# Patient Record
Sex: Female | Born: 1950 | Race: White | Hispanic: No | Marital: Married
Health system: Midwestern US, Academic
[De-identification: ages and names within clinical notes are randomized; demographics above are authoritative.]

---

## 2017-03-29 MED ORDER — REGADENOSON 0.4 MG/5 ML IV SYRG
.4 mg | Freq: Once | INTRAVENOUS | 0 refills | Status: CP
Start: 2017-03-29 — End: ?

## 2017-03-29 MED ORDER — NITROGLYCERIN 0.4 MG SL SUBL
.4 mg | SUBLINGUAL | 0 refills | Status: AC | PRN
Start: 2017-03-29 — End: ?

## 2017-03-29 MED ORDER — AMINOPHYLLINE 500 MG/20 ML IV SOLN
50 mg | INTRAVENOUS | 0 refills | Status: AC | PRN
Start: 2017-03-29 — End: ?

## 2017-03-29 MED ORDER — ALBUTEROL SULFATE 90 MCG/ACTUATION IN HFAA
2 | RESPIRATORY_TRACT | 0 refills | Status: DC | PRN
Start: 2017-03-29 — End: 2017-04-03

## 2017-03-29 MED ORDER — SODIUM CHLORIDE 0.9 % IV SOLP
250 mL | INTRAVENOUS | 0 refills | Status: AC | PRN
Start: 2017-03-29 — End: ?

## 2017-04-03 MED ORDER — EZETIMIBE 10 MG PO TAB
10 mg | ORAL_TABLET | Freq: Every day | ORAL | 3 refills | Status: DC
Start: 2017-04-03 — End: 2017-06-07

## 2017-04-10 MED ORDER — ASPIRIN 325 MG PO TAB
325 mg | Freq: Once | ORAL | 0 refills | Status: CP
Start: 2017-04-10 — End: ?

## 2017-04-10 MED ORDER — ALUMINUM-MAGNESIUM HYDROXIDE 200-200 MG/5 ML PO SUSP
30 mL | ORAL | 0 refills | Status: DC | PRN
Start: 2017-04-10 — End: 2017-04-10

## 2017-04-10 MED ORDER — DIPHENHYDRAMINE HCL 50 MG/ML IJ SOLN
25 mg | INTRAVENOUS | 0 refills | Status: DC | PRN
Start: 2017-04-10 — End: 2017-04-10

## 2017-04-10 MED ORDER — NITROGLYCERIN 0.4 MG SL SUBL
.4 mg | SUBLINGUAL | 0 refills | Status: DC | PRN
Start: 2017-04-10 — End: 2017-04-10

## 2017-04-10 MED ORDER — TEMAZEPAM 15 MG PO CAP
15 mg | Freq: Every evening | ORAL | 0 refills | Status: DC | PRN
Start: 2017-04-10 — End: 2017-04-10

## 2017-04-10 MED ORDER — MAGNESIUM HYDROXIDE 2,400 MG/10 ML PO SUSP
10 mL | ORAL | 0 refills | Status: DC | PRN
Start: 2017-04-10 — End: 2017-04-10

## 2017-04-10 MED ORDER — SODIUM CHLORIDE 0.9 % IV SOLP
INTRAVENOUS | 0 refills | Status: DC
Start: 2017-04-10 — End: 2017-04-10

## 2017-04-10 MED ORDER — ONDANSETRON HCL (PF) 4 MG/2 ML IJ SOLN
4 mg | INTRAVENOUS | 0 refills | Status: DC | PRN
Start: 2017-04-10 — End: 2017-04-10

## 2017-04-10 MED ORDER — ISOSORBIDE MONONITRATE 30 MG PO TB24
30 mg | ORAL_TABLET | Freq: Every morning | ORAL | 3 refills | 30.00000 days | Status: DC
Start: 2017-04-10 — End: 2017-04-12

## 2017-04-10 MED ORDER — DIPHENHYDRAMINE HCL 25 MG PO CAP
25 mg | ORAL | 0 refills | Status: DC | PRN
Start: 2017-04-10 — End: 2017-04-10

## 2017-04-10 MED ORDER — LIDOCAINE (PF) 10 MG/ML (1 %) IJ SOLN
.1-2 mL | INTRAMUSCULAR | 0 refills | Status: DC | PRN
Start: 2017-04-10 — End: 2017-04-10

## 2017-04-12 MED ORDER — RANOLAZINE 500 MG PO TB12
500 mg | ORAL_TABLET | Freq: Two times a day (BID) | ORAL | 5 refills | Status: DC
Start: 2017-04-12 — End: 2017-06-07

## 2017-04-22 MED ORDER — NITROGLYCERIN 0.4 MG SL SUBL
ORAL_TABLET | SUBLINGUAL | 3 refills | 9.00000 days | Status: DC
Start: 2017-04-22 — End: 2018-12-03

## 2017-05-01 MED ORDER — ONDANSETRON HCL (PF) 4 MG/2 ML IJ SOLN
4 mg | INTRAVENOUS | 0 refills | Status: DC | PRN
Start: 2017-05-01 — End: 2017-05-02

## 2017-05-01 MED ORDER — FENTANYL CITRATE (PF) 50 MCG/ML IJ SOLN
12.5 ug | Freq: Once | INTRAVENOUS | 0 refills | Status: CP
Start: 2017-05-01 — End: ?

## 2017-05-01 MED ORDER — PRAMIPEXOLE 4.5 MG PO TB24
4.5 mg | Freq: Every evening | ORAL | 0 refills | Status: DC
Start: 2017-05-01 — End: 2017-05-02

## 2017-05-01 MED ORDER — NITROGLYCERIN IN 5 % DEXTROSE 50 MG/250 ML (200 MCG/ML) IV SOLN
.1-1.5 ug/kg/min | INTRAVENOUS | 0 refills | Status: DC
Start: 2017-05-01 — End: 2017-05-02

## 2017-05-01 MED ORDER — GLIMEPIRIDE 4 MG PO TAB
8 mg | Freq: Every day | ORAL | 0 refills | Status: DC
Start: 2017-05-01 — End: 2017-05-02

## 2017-05-01 MED ORDER — RANOLAZINE 500 MG PO TB12
500 mg | Freq: Two times a day (BID) | ORAL | 0 refills | Status: DC
Start: 2017-05-01 — End: 2017-05-02

## 2017-05-01 MED ORDER — ALUMINUM-MAGNESIUM HYDROXIDE 200-200 MG/5 ML PO SUSP
30 mL | ORAL | 0 refills | Status: DC | PRN
Start: 2017-05-01 — End: 2017-05-02

## 2017-05-01 MED ORDER — GABAPENTIN 300 MG PO CAP
300 mg | Freq: Every evening | ORAL | 0 refills | Status: DC
Start: 2017-05-01 — End: 2017-05-02

## 2017-05-01 MED ORDER — TICAGRELOR 90 MG PO TAB
90 mg | Freq: Two times a day (BID) | ORAL | 0 refills | Status: DC
Start: 2017-05-01 — End: 2017-05-02

## 2017-05-01 MED ORDER — DIPHENHYDRAMINE HCL 50 MG/ML IJ SOLN
25 mg | INTRAVENOUS | 0 refills | Status: DC | PRN
Start: 2017-05-01 — End: 2017-05-02

## 2017-05-01 MED ORDER — DIPHENHYDRAMINE HCL 25 MG PO CAP
25 mg | ORAL | 0 refills | Status: DC | PRN
Start: 2017-05-01 — End: 2017-05-02

## 2017-05-01 MED ORDER — MAGNESIUM OXIDE 400 MG PO TAB
400 mg | Freq: Every day | ORAL | 0 refills | Status: DC
Start: 2017-05-01 — End: 2017-05-02

## 2017-05-01 MED ORDER — SODIUM CHLORIDE 0.9 % IV SOLP
INTRAVENOUS | 0 refills | Status: DC
Start: 2017-05-01 — End: 2017-05-02

## 2017-05-01 MED ORDER — ACETAMINOPHEN/LIDOCAINE/ANTACID DS(#) 1:1:3  PO SUSP
30 mL | Freq: Once | ORAL | 0 refills | Status: CP
Start: 2017-05-01 — End: ?

## 2017-05-01 MED ORDER — EZETIMIBE 10 MG PO TAB
10 mg | Freq: Every day | ORAL | 0 refills | Status: DC
Start: 2017-05-01 — End: 2017-05-02

## 2017-05-01 MED ORDER — NITROGLYCERIN 0.4 MG SL SUBL
.4 mg | SUBLINGUAL | 0 refills | Status: DC | PRN
Start: 2017-05-01 — End: 2017-05-02

## 2017-05-01 MED ORDER — DOCUSATE SODIUM 100 MG PO CAP
100 mg | Freq: Every day | ORAL | 0 refills | Status: DC | PRN
Start: 2017-05-01 — End: 2017-05-02

## 2017-05-01 MED ORDER — LIDOCAINE (PF) 10 MG/ML (1 %) IJ SOLN
.1-2 mL | INTRAMUSCULAR | 0 refills | Status: DC | PRN
Start: 2017-05-01 — End: 2017-05-02

## 2017-05-01 MED ORDER — PIOGLITAZONE 15 MG PO TAB
15 mg | Freq: Every day | ORAL | 0 refills | Status: DC
Start: 2017-05-01 — End: 2017-05-02

## 2017-05-01 MED ORDER — HELP MEDICATION
Freq: Every evening | ORAL | 0 refills | Status: DC
Start: 2017-05-01 — End: 2017-05-02

## 2017-05-01 MED ORDER — LEVOTHYROXINE 75 MCG PO TAB
75 ug | Freq: Every day | ORAL | 0 refills | Status: DC
Start: 2017-05-01 — End: 2017-05-02

## 2017-05-01 MED ORDER — FENTANYL CITRATE (PF) 50 MCG/ML IJ SOLN
25 ug | Freq: Once | INTRAVENOUS | 0 refills | Status: DC
Start: 2017-05-01 — End: 2017-05-02

## 2017-05-01 MED ORDER — TEMAZEPAM 15 MG PO CAP
15 mg | Freq: Every evening | ORAL | 0 refills | Status: DC | PRN
Start: 2017-05-01 — End: 2017-05-02

## 2017-05-01 MED ORDER — PANTOPRAZOLE 40 MG PO TBEC
40 mg | Freq: Two times a day (BID) | ORAL | 0 refills | Status: DC
Start: 2017-05-01 — End: 2017-05-02

## 2017-05-01 MED ORDER — ASPIRIN 325 MG PO TAB
325 mg | Freq: Once | ORAL | 0 refills | Status: CP
Start: 2017-05-01 — End: ?

## 2017-05-02 MED ORDER — ASPIRIN 81 MG PO TBEC
81 mg | ORAL_TABLET | Freq: Every day | ORAL | 3 refills | Status: DC
Start: 2017-05-02 — End: 2018-11-17

## 2017-05-02 MED ORDER — ASPIRIN 81 MG PO CHEW
81 mg | Freq: Every day | ORAL | 0 refills | Status: DC
Start: 2017-05-02 — End: 2017-05-02

## 2017-05-02 MED ORDER — TICAGRELOR 90 MG PO TAB
90 mg | ORAL_TABLET | Freq: Two times a day (BID) | ORAL | 3 refills | Status: DC
Start: 2017-05-02 — End: 2017-05-22

## 2017-05-03 MED ORDER — EVOLOCUMAB 140 MG/ML SC SYRG
140 mg | SUBCUTANEOUS | 12 refills | 28.00000 days | Status: DC
Start: 2017-05-03 — End: 2017-06-07

## 2017-05-22 MED ORDER — CLOPIDOGREL 75 MG PO TAB
75 mg | ORAL_TABLET | Freq: Every day | ORAL | 3 refills | 90.00000 days | Status: DC
Start: 2017-05-22 — End: 2017-10-15

## 2017-06-03 ENCOUNTER — Encounter: Admit: 2017-06-03 | Discharge: 2017-06-03 | Payer: MEDICARE

## 2017-06-03 NOTE — Progress Notes
The Prior Authorization for Repatha was denied for Deborah Fox.  The [name].    [name]  Pharmacy Patient Advocate    [name]

## 2017-06-07 ENCOUNTER — Ambulatory Visit: Admit: 2017-06-07 | Discharge: 2017-06-08 | Payer: BC Managed Care – PPO

## 2017-06-07 ENCOUNTER — Encounter: Admit: 2017-06-07 | Discharge: 2017-06-07 | Payer: MEDICARE

## 2017-06-07 DIAGNOSIS — I25118 Atherosclerotic heart disease of native coronary artery with other forms of angina pectoris: Principal | ICD-10-CM

## 2017-06-07 DIAGNOSIS — I10 Essential (primary) hypertension: ICD-10-CM

## 2017-06-07 DIAGNOSIS — E1140 Type 2 diabetes mellitus with diabetic neuropathy, unspecified: ICD-10-CM

## 2017-06-07 DIAGNOSIS — Z951 Presence of aortocoronary bypass graft: ICD-10-CM

## 2017-06-07 DIAGNOSIS — E7800 Pure hypercholesterolemia, unspecified: ICD-10-CM

## 2017-06-07 DIAGNOSIS — I2510 Atherosclerotic heart disease of native coronary artery without angina pectoris: Principal | ICD-10-CM

## 2017-06-07 DIAGNOSIS — Z789 Other specified health status: ICD-10-CM

## 2017-06-07 DIAGNOSIS — E119 Type 2 diabetes mellitus without complications: ICD-10-CM

## 2017-06-07 DIAGNOSIS — E785 Hyperlipidemia, unspecified: ICD-10-CM

## 2017-06-07 DIAGNOSIS — R9439 Abnormal result of other cardiovascular function study: ICD-10-CM

## 2017-06-07 DIAGNOSIS — R600 Localized edema: ICD-10-CM

## 2017-06-07 MED ORDER — FUROSEMIDE 20 MG PO TAB
20 mg | ORAL_TABLET | Freq: Every morning | ORAL | 6 refills | Status: SS
Start: 2017-06-07 — End: 2017-07-31

## 2017-06-07 NOTE — Progress Notes
Date of Service: 06/07/2017    Deborah Fox is a 66 y.o. female.       HPI     Patient has returned to the office for a follow-up office visit, she was last seen on Apr 22, 2017.  Patient underwent a left heart catheterization on April 10, 2017 and she was found to have severe disease of the RCA that supplied a large posterolateral ventricular branch but not the PDA.  Based on the results of the previous myocardial perfusion imaging study and her symptoms of chest pain it was decided to proceed with another left heart catheterization.      This procedure was performed on May 01, 2017, it resulted in PCI of the distal to proximal RCA with drug-eluting stents, and orbital atherectomy from the proximal to distal RCA with  good plaque modification.    Since March 29, 2017 patient gained approximately 13 pounds.  She is very frustrated.  She does not think that is due to her diet or physical inactivity.  She also does not report any sodium indulgence.  She is currently-year-old in cardiac rehab.    She also has statin intolerance and had difficulty taking statin therapy as well as Zetia.  The most recent LDL on 05/01/2017 was 162 mg/dL, the total cholesterol was 253 mg/dL, triglycerides 161 mg/dL, and HDL 58 mg/dL,    Due to dyspnea she also had intolerance to ticagrelor and was switched to clopidogrel.         Vitals:    06/07/17 0806 06/07/17 0815   BP: 148/70 138/64   Pulse: 64    Weight: 89.3 kg (196 lb 12.8 oz)    Height: 1.575 m (5' 2)      Body mass index is 36 kg/m???.     Past Medical History  Patient Active Problem List    Diagnosis Date Noted   ??? 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 Abnormal thallium stress test 04/03/2017   ??? Hospital discharge follow-up 04/03/2017   ??? 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 Initial neck injections of Botox caused neck extensor weakness.     ??? Type 2 diabetes mellitus (HCC) 04/06/2015   ??? Precordial pain 04/06/2015 ??? 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 Weight gain, abnormal 04/06/2015   ??? Pre-operative cardiovascular examination 04/08/2014   ??? Abdominal pain 06/24/2013   ??? Claudication (HCC) 01/08/2012   ??? Chest pain 12/28/2010   ??? 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 CAD (coronary artery disease) 10/06/2009     04/10/17 Cardiac Catheterization by Dr. Chales Abrahams:  1. Significant coronary artery disease manifesting with complete occlusion of the proximal circumflex artery and severe disease of the proximal mid and distal right coronary artery and moderate disease of the left anterior descending.  2. Patent saphenous vein graft to diagonal 1, obtuse marginal 1, obtuse marginal 2, and a jump radial graft to the posterior descending artery.  3. Patent left internal mammary artery to left anterior descending.  4. Severe disease of the right coronary artery that appears to supply a large posterolateral ventricular branch but not the posterior descending artery.  5. Normal left ventricular end-diastolic pressure.  ???     ??? Sleep apnea 10/06/2009   ??? RLS (restless legs syndrome) 10/06/2009   ??? Hypothyroidism 10/06/2009   ??? HTN (hypertension) 10/06/2009   ??? Hyperlipidemia 10/06/2009   ??? GERD (gastroesophageal reflux  disease) 10/06/2009   ??? Lichen planus 10/06/2009         Review of Systems   Constitution: Positive for weight gain.   HENT: Negative.    Eyes: Negative.    Cardiovascular: Positive for dyspnea on exertion and leg swelling.   Respiratory: Negative.    Endocrine: Negative.    Hematologic/Lymphatic: Negative.    Skin: Negative.    Musculoskeletal: Negative.    Gastrointestinal: Negative.    Genitourinary: Negative.    Neurological: Negative.    Psychiatric/Behavioral: Negative.    Allergic/Immunologic: Negative.        Physical Exam  General Appearance: normal in appearance  Skin: warm, moist, no ulcers or xanthomas  Eyes: conjunctivae and lids normal, pupils are equal and round Lips & Oral Mucosa: no pallor or cyanosis  Neck Veins: neck veins are flat, neck veins are not distended  Chest Inspection: chest is normal in appearance  Respiratory Effort: breathing comfortably, no respiratory distress  Auscultation/Percussion: lungs clear to auscultation, no rales or rhonchi, no wheezing  Cardiac Rhythm: regular rhythm and normal rate  Cardiac Auscultation: S1, S2 normal, no rub, no gallop  Murmurs: no murmur  Carotid Arteries: normal carotid upstroke bilaterally, no bruit  Lower Extremity Edema: no lower extremity edema  Abdominal Exam: soft, non-tender, no masses, bowel sounds normal  Liver & Spleen: no organomegaly  Language and Memory: patient responsive and seems to comprehend information  Neurologic Exam: neurological assessment grossly intact      Cardiovascular Studies  Twelve-lead EKG demonstrates normal sinus rhythm, PACs are present    Problems Addressed Today  Encounter Diagnoses   Name Primary?   ??? Coronary artery disease of native artery of native heart with stable angina pectoris (HCC)    ??? Essential hypertension    ??? Pure hypercholesterolemia    ??? Type 2 diabetes mellitus with diabetic neuropathy, without long-term current use of insulin (HCC)        Assessment and Plan     Assessment:    1.  Coronary artery disease  ??? Status post PCI to RCA on 05/01/2017, patient has not experienced any symptoms compatible with angina  ??? Status post CABG, patient underwent a left heart catheterization on April 10, 2017, she is known to have significant coronary artery disease that consist of complete occlusion of the proximal left circumflex artery, severe disease of the proximal mid and distal RCA, moderate disease of the LAD, patent SVG to D1, OM1 and OM 2, jump radial graft to the PDA, patent LIMA to LAD, severe disease of the RCA??? the right coronary system was addressed in this last left heart catheterization, please see above details ??? Patient did developed intolerance to ticagrelor and she was switched to clopidogrel    2.  Diabetes mellitus type 2  ??? Patient is on 2 oral antidiabetics    3.  Hyperlipidemia  ??? The total cholesterol and the LDL are poorly controlled  ??? Patient has intolerance to statin therapy as well as Zetia    4.  History of morbid obesity  ??? Patient underwent previous gastric bypass    5.  Weight gain and bilateral lower extremity edema  ??? This could be due to salt indulgence and probably diastolic dysfunction    Plan:    1.  Furosemide 20 mg p.o. daily  2.  I did ask the patient to eat foods that are high in potassium such as bananas and tomatoes  3.  Chem-7 in 1 week  4.  2D echo Doppler study evaluation  5.  Follow-up office visit in 3 months  6.  I did encourage risk factors modification, low-fat, low-cholesterol, low-carb well-hydrated diet, regular physical activity, I also encouraged the patient to complete the entire cardiac rehab program  7.  In the light of statin intolerance as well as intolerance for Zetia and elevated LDL and underlying significant coronary artery disease we will initiate approval for one  of the PCSK9 inhibitors.  8.  Also, in the light of recent revascularization we will asked the patient to discontinue the Ranexa.         Current Medications (including today's revisions)  ??? aspirin EC 81 mg tablet Take 1 tablet by mouth daily. Take with food.   ??? clopiDOGrel (PLAVIX) 75 mg tablet Take 1 tablet by mouth daily.   ??? dexlansoprazole (+) (DEXILANT) 60 mg capsule Take 60 mg by mouth twice daily.   ??? gabapentin (NEURONTIN) 300 mg capsule Take 300 mg by mouth at bedtime daily.   ??? glimepiride (AMARYL) 4 mg tablet Take 8 mg by mouth daily with breakfast.   ??? levothyroxine (SYNTHROID) 75 mcg tablet Take 75 mcg by mouth daily.   ??? magnesium oxide (MAG-OX) 400 mg tablet Take 400 mg by mouth daily.   ??? nitroglycerin (NITROSTAT) 0.4 mg tablet Take one tablet sublingually three times five minutes apart for chest pain.   ??? pioglitazone (ACTOS) 15 mg tablet Take 15 mg by mouth daily.   ??? pramipexole ER(+) (MIRAPEX ER) 4.5 mg tablet Take 1 tablet by mouth twice daily.   ??? ranolazine ER (RANEXA) 500 mg tablet Take 500 mg by mouth twice daily.

## 2017-06-10 ENCOUNTER — Encounter: Admit: 2017-06-10 | Discharge: 2017-06-10 | Payer: MEDICARE

## 2017-06-10 MED ORDER — EVOLOCUMAB 140 MG/ML SC PNIJ
140 mg | SUBCUTANEOUS | 11 refills | 28.00000 days | Status: AC
Start: 2017-06-10 — End: 2017-06-27

## 2017-06-10 NOTE — Progress Notes
Norina BuzzardBarbara V Montellano's prescription for Repatha is not able to be [facility] due to the patient's prescription insurance.  Pharmacy has contacted Serita GrammesBarbara V Bottomley to notify them that their prescription will be sent to [facility] as required by their prescription insurance.    Approval Period: 06/05/17-06/05/18.    Juliane Lackachael Brynleigh Sequeira  Pharmacy Patient Advocate

## 2017-06-17 ENCOUNTER — Encounter: Admit: 2017-06-17 | Discharge: 2017-06-17 | Payer: MEDICARE

## 2017-06-17 DIAGNOSIS — I25118 Atherosclerotic heart disease of native coronary artery with other forms of angina pectoris: Principal | ICD-10-CM

## 2017-06-17 DIAGNOSIS — E7800 Pure hypercholesterolemia, unspecified: ICD-10-CM

## 2017-06-17 DIAGNOSIS — E1140 Type 2 diabetes mellitus with diabetic neuropathy, unspecified: ICD-10-CM

## 2017-06-17 DIAGNOSIS — I10 Essential (primary) hypertension: ICD-10-CM

## 2017-06-17 LAB — BASIC METABOLIC PANEL
Lab: 0.8 % (ref 11–15)
Lab: 104 FL (ref 80–100)
Lab: 13 % (ref 24–44)
Lab: 141 g/dL (ref 12.0–15.0)
Lab: 152 10*3/uL — ABNORMAL HIGH (ref 80–115)
Lab: 21 g/dL — ABNORMAL HIGH (ref 9.8–20.1)
Lab: 28 pg (ref 26–34)
Lab: 4.1 % (ref 36–45)
Lab: 9.6 FL (ref 7–11)

## 2017-06-18 ENCOUNTER — Ambulatory Visit: Admit: 2017-06-18 | Discharge: 2017-06-18 | Payer: BC Managed Care – PPO

## 2017-06-18 DIAGNOSIS — E7800 Pure hypercholesterolemia, unspecified: ICD-10-CM

## 2017-06-18 DIAGNOSIS — I25118 Atherosclerotic heart disease of native coronary artery with other forms of angina pectoris: Principal | ICD-10-CM

## 2017-06-18 DIAGNOSIS — E1140 Type 2 diabetes mellitus with diabetic neuropathy, unspecified: Secondary | ICD-10-CM

## 2017-06-18 DIAGNOSIS — I10 Essential (primary) hypertension: ICD-10-CM

## 2017-06-18 MED ORDER — PERFLUTREN LIPID MICROSPHERES 1.1 MG/ML IV SUSP
1-20 mL | Freq: Once | INTRAVENOUS | 0 refills | Status: CP
Start: 2017-06-18 — End: ?
  Administered 2017-06-18: 22:00:00 1 mL via INTRAVENOUS

## 2017-06-18 NOTE — Progress Notes
Peripheral IV Insertion Note:  Patient Side: right  Line Orientation:Antecubital  IV Catheter Size: 22G  Number of Attempts:1.  IV capped and flushed with Normal Saline.  IV site without redness, swelling, or pain.  New dressing placed.    After procedure IV cannula removed intact and hemostasis achieved.    Procedure explained, questions answered and Definity administered per standard without complications.   Total of __1_ ml of Definity/NS given slow IVP per sonographer direction.    Morene Crockereborah Talonda Artist, RN

## 2017-06-27 ENCOUNTER — Encounter: Admit: 2017-06-27 | Discharge: 2017-06-27 | Payer: MEDICARE

## 2017-06-27 MED ORDER — EVOLOCUMAB 140 MG/ML SC PNIJ
140 mg | SUBCUTANEOUS | 11 refills | 28.00000 days | Status: AC
Start: 2017-06-27 — End: 2018-04-30

## 2017-06-27 NOTE — Telephone Encounter
-----   Message from Nickolas MadridMarina Hannen, MD sent at 06/27/2017 12:56 PM CDT -----  Please let this patient know that her echocardiogram is overall unremarkable, demonstrated normal left ventricular systolic function.    Thank you    ----- Message -----  From: Nickolas MadridHannen, Marina, MD  Sent: 06/21/2017  10:43 AM  To: Nickolas MadridMarina Hannen, MD

## 2017-06-27 NOTE — Telephone Encounter
Results and recommendations called to patient.

## 2017-07-27 ENCOUNTER — Encounter: Admit: 2017-07-27 | Discharge: 2017-07-27 | Payer: MEDICARE

## 2017-07-27 ENCOUNTER — Emergency Department: Admit: 2017-07-27 | Discharge: 2017-07-27 | Payer: MEDICARE

## 2017-07-27 DIAGNOSIS — E785 Hyperlipidemia, unspecified: ICD-10-CM

## 2017-07-27 DIAGNOSIS — R079 Chest pain, unspecified: Secondary | ICD-10-CM

## 2017-07-27 DIAGNOSIS — E119 Type 2 diabetes mellitus without complications: ICD-10-CM

## 2017-07-27 DIAGNOSIS — R072 Precordial pain: ICD-10-CM

## 2017-07-27 DIAGNOSIS — I10 Essential (primary) hypertension: ICD-10-CM

## 2017-07-27 DIAGNOSIS — R9439 Abnormal result of other cardiovascular function study: ICD-10-CM

## 2017-07-27 DIAGNOSIS — I2510 Atherosclerotic heart disease of native coronary artery without angina pectoris: Principal | ICD-10-CM

## 2017-07-28 ENCOUNTER — Encounter: Admit: 2017-07-28 | Discharge: 2017-07-28 | Payer: MEDICARE

## 2017-07-28 ENCOUNTER — Inpatient Hospital Stay
Admit: 2017-07-28 | Discharge: 2017-07-31 | Disposition: A | Discharge status: Home / Self Care | Payer: BC Managed Care – PPO

## 2017-07-28 LAB — POC TROPONIN
Lab: 0 ng/mL (ref 0.00–0.05)
Lab: 0 ng/mL (ref 0.00–0.05)

## 2017-07-28 LAB — MAGNESIUM
Lab: 1.8 mg/dL (ref 1.6–2.6)
Lab: 1.8 mg/dL — ABNORMAL LOW (ref 1.6–2.6)

## 2017-07-28 LAB — PHENCYCLIDINES-URINE RANDOM: Lab: NEGATIVE % (ref 0–2)

## 2017-07-28 LAB — CBC AND DIFF
Lab: 0.1 10*3/uL (ref 0–0.20)
Lab: 0.1 10*3/uL (ref 0–0.45)
Lab: 0.4 10*3/uL (ref 0–0.80)
Lab: 1 10*3/uL (ref 1.0–4.8)
Lab: 4.3 10*3/uL (ref 1.8–7.0)
Lab: 4.3 M/UL (ref 4.0–5.0)
Lab: 5.9 10*3/uL (ref 4.5–11.0)
Lab: 0 % (ref 0–2)
Lab: 0 10*3/uL (ref 0–0.20)
Lab: 0.1 10*3/uL (ref 0–0.45)
Lab: 0.4 10*3/uL (ref 0–0.80)
Lab: 1.2 10*3/uL (ref 1.0–4.8)
Lab: 10 FL (ref 7–11)
Lab: 117 10*3/uL — ABNORMAL LOW (ref 150–400)
Lab: 12 g/dL (ref 12.0–15.0)
Lab: 15 % — ABNORMAL HIGH (ref 11–15)
Lab: 2 % (ref 0–5)
Lab: 24 % (ref 24–44)
Lab: 3.4 10*3/uL (ref 1.8–7.0)
Lab: 32 pg (ref 26–34)
Lab: 33 g/dL (ref 32.0–36.0)
Lab: 37 % (ref 36–45)
Lab: 5.1 10*3/uL (ref 4.5–11.0)
Lab: 67 % (ref 41–77)
Lab: 7 % (ref 4–12)
Lab: 95 FL (ref 80–100)

## 2017-07-28 LAB — POC CREATININE, RAD: Lab: 0.8 mg/dL (ref 0.4–1.00)

## 2017-07-28 LAB — BARBITURATES-URINE RANDOM: Lab: NEGATIVE mg/dL (ref 0.4–1.00)

## 2017-07-28 LAB — TROPONIN-I
Lab: 0 ng/mL (ref 0.0–0.05)
Lab: 0 ng/mL (ref 0.0–0.05)
Lab: 0 ng/mL (ref 0.0–0.05)

## 2017-07-28 LAB — OPIATES-URINE RANDOM: Lab: NEGATIVE % (ref 0–5)

## 2017-07-28 LAB — BASIC METABOLIC PANEL
Lab: 108 MMOL/L (ref 98–110)
Lab: 143 MMOL/L (ref 137–147)
Lab: 162 mg/dL — ABNORMAL HIGH (ref 70–100)
Lab: 28 MMOL/L (ref 21–30)
Lab: 4.1 MMOL/L (ref 3.5–5.1)
Lab: 7 g/dL (ref 3–12)

## 2017-07-28 LAB — TSH WITH FREE T4 REFLEX
Lab: 1.8 uU/mL (ref 0.35–5.00)
Lab: 1.7 uU/mL (ref 0.35–5.00)

## 2017-07-28 LAB — POC GLUCOSE: Lab: 159 mg/dL — ABNORMAL HIGH (ref 70–100)

## 2017-07-28 LAB — BENZODIAZEPINES-URINE RANDOM: Lab: NEGATIVE mg/dL (ref 8.5–10.6)

## 2017-07-28 LAB — CANNABINOIDS-URINE RANDOM: Lab: NEGATIVE mL/min — ABNORMAL LOW (ref 24–44)

## 2017-07-28 LAB — COCAINE-URINE RANDOM: Lab: NEGATIVE mL/min (ref 4–12)

## 2017-07-28 LAB — BNP POC ER: Lab: 130 pg/mL — ABNORMAL HIGH (ref 0–100)

## 2017-07-28 LAB — POC PT/INR: Lab: 1 (ref 0.8–1.2)

## 2017-07-28 LAB — AMPHETAMINES-URINE RANDOM: Lab: NEGATIVE mg/dL — ABNORMAL LOW (ref 7–25)

## 2017-07-28 MED ORDER — ASPIRIN 81 MG PO TBEC
81 mg | Freq: Every day | ORAL | 0 refills | Status: DC
Start: 2017-07-28 — End: 2017-08-01
  Administered 2017-07-28 – 2017-07-31 (×3): 81 mg via ORAL

## 2017-07-28 MED ORDER — ONDANSETRON HCL (PF) 4 MG/2 ML IJ SOLN
4 mg | INTRAVENOUS | 0 refills | Status: DC | PRN
Start: 2017-07-28 — End: 2017-08-01

## 2017-07-28 MED ORDER — HEPARIN, PORCINE (PF) 5,000 UNIT/0.5 ML IJ SYRG
5000 [IU] | SUBCUTANEOUS | 0 refills | Status: DC
Start: 2017-07-28 — End: 2017-08-01
  Administered 2017-07-28 – 2017-07-31 (×9): 5000 [IU] via SUBCUTANEOUS

## 2017-07-28 MED ORDER — FUROSEMIDE 10 MG/ML IJ SOLN
40 mg | Freq: Once | INTRAVENOUS | 0 refills | Status: CP
Start: 2017-07-28 — End: ?
  Administered 2017-07-28: 08:00:00 40 mg via INTRAVENOUS

## 2017-07-28 MED ORDER — LEVOTHYROXINE 75 MCG PO TAB
75 ug | Freq: Every day | ORAL | 0 refills | Status: DC
Start: 2017-07-28 — End: 2017-08-01
  Administered 2017-07-28 – 2017-07-31 (×4): 75 ug via ORAL

## 2017-07-28 MED ORDER — PATIENTS OWN MEDICATION
1 | Freq: Two times a day (BID) | ORAL | 0 refills | Status: DC
Start: 2017-07-28 — End: 2017-07-28

## 2017-07-28 MED ORDER — PRAMIPEXOLE 4.5 MG PO TB24
4.5 mg | Freq: Two times a day (BID) | ORAL | 0 refills | Status: DC
Start: 2017-07-28 — End: 2017-08-01

## 2017-07-28 MED ORDER — PANTOPRAZOLE 40 MG PO TBEC
40 mg | Freq: Every day | ORAL | 0 refills | Status: DC
Start: 2017-07-28 — End: 2017-08-01
  Administered 2017-07-28 – 2017-07-31 (×4): 40 mg via ORAL

## 2017-07-28 MED ORDER — CAPTOPRIL 12.5 MG PO TAB
6.25 mg | Freq: Three times a day (TID) | ORAL | 0 refills | Status: CN
Start: 2017-07-28 — End: ?

## 2017-07-28 MED ORDER — MAGNESIUM SULFATE IN D5W 1 GRAM/100 ML IV PGBK
1 g | Freq: Once | INTRAVENOUS | 0 refills | Status: CP
Start: 2017-07-28 — End: ?
  Administered 2017-07-28: 20:00:00 1 g via INTRAVENOUS

## 2017-07-28 MED ORDER — CLOPIDOGREL 75 MG PO TAB
75 mg | Freq: Every day | ORAL | 0 refills | Status: DC
Start: 2017-07-28 — End: 2017-08-01
  Administered 2017-07-28 – 2017-07-31 (×4): 75 mg via ORAL

## 2017-07-28 MED ORDER — NITROGLYCERIN 0.4 MG SL SUBL
.4 mg | SUBLINGUAL | 0 refills | Status: DC | PRN
Start: 2017-07-28 — End: 2017-08-01

## 2017-07-28 MED ORDER — MELATONIN 3 MG PO TAB
3 mg | Freq: Every evening | ORAL | 0 refills | Status: DC | PRN
Start: 2017-07-28 — End: 2017-08-01

## 2017-07-28 MED ORDER — GABAPENTIN 300 MG PO CAP
300 mg | Freq: Every evening | ORAL | 0 refills | Status: DC
Start: 2017-07-28 — End: 2017-08-01
  Administered 2017-07-28 – 2017-07-31 (×4): 300 mg via ORAL

## 2017-07-28 NOTE — Acute Stroke Response
RN Stroke Activation Summary       Date of Service:  07/28/2017  Deborah Fox is a 66 y.o.  female.     DOB: [birth year]                  MRN#:  [id number]  Allergies:  Cephalexin; Hydrocodone-acetaminophen; Oxycodone-acetaminophen; Statins-hmg-coa reductase inhibitors; Doxycycline; Erythromycin; Flagyl [metronidazole]; Penicillins; and Sulfa (sulfonamide antibiotics)    Patient Arrival: 2103  ASRT Arrival: 2122  Location of Response : ED [place]    Page Received: 2118          Clinical Presentation: Right facial droop, Sensory changes    Total Stroke Scale Score: 1     Signs & Symptoms: Onset of Symptoms  Onset of Symptoms - Date: 07/27/17  Onset of Symptoms - Time: 1900     Dysphagia screen:  Did Patient Pass The Swallow Screen Part I?: Yes     Did Patient Pass The Swallow Screen Part II: Yes     Did Patient Pass The Swallow Screen: Yes         Assessment & Plan     Summary:  Pt with PMH of CAD with CABG in 2012, DM, HTN, HLD, PCA of RCA, GERD, OSA, and skin CA presented to ED c/o chest pain, while transporting into ED room reported facial numbness, light headedness, dizziness and right sided weakness. Symptom onset was approximately 1900. CT obtained, low suspicion for stroke.    CT/CTP/CTA/IR:   CT time : 2130  CT results communicated to stroke team: 2140  Lab results available to stroke team: 2143         N/A      Plan: Defer to ED physicians for cardiac work up    Call Completion: 2143    RN handoff: Karren Burly, RN     History of Present Illness     Past Medical History:   Diagnosis Date   ??? CAD (coronary artery disease)    ??? Diabetes (HCC)    ??? HTN (hypertension)    ??? Hyperlipemia      Past Surgical History:   Procedure Laterality Date   ??? CORONARY ARTERY BYPASS GRAFT  2007   ??? HX GASTRIC BYPASS  05/24/2009   ??? HERNIA REPAIR  03-15-15   ??? HX CHOLECYSTECTOMY     ??? HX CORONARY ARTERY BYPASS GRAFT       Social History     Social History   ??? Marital status: Married     Spouse name: N/A ??? Number of children: N/A   ??? Years of education: N/A     Social History Main Topics   ??? Smoking status: Never Smoker   ??? Smokeless tobacco: Never Used   ??? Alcohol use No   ??? Drug use: No   ??? Sexual activity: Not on file     Other Topics Concern   ??? Not on file     Social History Narrative   ??? No narrative on file         Bonnita Nasuti, RN

## 2017-07-28 NOTE — ED Notes
CTP HC 411 (ready) please call Sandy at [phone]

## 2017-07-28 NOTE — Acute Stroke Response
NAME:Deborah Fox             MRN: [id number]             DOB:Nov 28, 1951          AGE: 66 y.o.  ADMISSION DATE: 07/27/2017             DAYS ADMITTED: LOS: 0 days    Date of Service:  07/27/2017    Allergies:  Cephalexin; Hydrocodone-acetaminophen; Oxycodone-acetaminophen; Statins-hmg-coa reductase inhibitors; Doxycycline; Erythromycin; Flagyl [metronidazole]; Penicillins; and Sulfa (sulfonamide antibiotics)    Type of ASRT note: Consult       Assessment & Plan     Chief Complaint: chest pain, sob, transient right facial sensory deficit  Assessment: Deborah Fox is a 66 y.o. female with CAD s/p stent 04/2017, CABG, HTN,  HLD, GERD, RLS, OSA, hypothyroidism, who presents to ED with chest pain, sob. Complained of right facial sensory changes on arrival to ED, but they had resolved by stroke team arrival. Has a chronic right facial asymmetry due to excision of skin cancer over right nasolabial fold.     NIHSS 1- right facial nasolabial flattening   CT head- no acute achanges. No ICH , mass effect or loss of gray-white interfac    Impression:  Low suspicion of acute stroke. Patient does not have any neurological deficits on exam (facial asymmetry is chronic). Her complaint today is chest pain + SOB.     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 Patient stroke activated on arrival due to reporting an odd sensation over the right face. About 3 hours prior to arrival, patient and her husband were at a party. She suddenly began feeling mid sternal chest pain, with accompanying sob, dizziness and lightheadedness. On arrival to ED, she felt an odd sensation over the right cheek, but cannot describe it. The sensation resolve dby time of stroke team arrival. On exam, she is noted to have right nasolabial flattening, but this appears to be chronic. (had skin cancer resection over right nasolabial fold. Flattening is seen in all of her ID photos).     She denies any focal weakness, numbness, slurred speech, visual changes.       Review of Systems     All other systems reviewed and are negative.    Constitutional: negative   Eyes: negative   Ears, nose, mouth, throat, and face: negative   Respiratory: negative   Cardiovascular: negative   Gastrointestinal: negative   Genitourinary: negative   Integument/breast: negative   Hematologic/lymphatic: negative   Musculoskeletal: negative   Neurological: negative   Behavioral/Psych: negative   Endocrine: negative   Allergic/Immunologic: negative    Stroke Activation Summary                         Signs & Symptoms: Onset of Symptoms  Onset of Symptoms - Time:  1900     CT:   1. ???No acute intracranial hemorrhage or mass effect.  2. ???Mild nonspecific white matter disease, likely due to chronic   microvascular ischemic changes.       BP: 174/78 (08/11 2010)  Temp: 37 ???C (98.6 ???F) (08/11 2010)  Pulse: 84 (08/11 2010)  Respirations: 20 PER MINUTE (08/11 2010)  SpO2: 98 % (08/11 2010)  O2 Delivery: None (Room Air) (08/11 2010)    NIHSS Completed at: 9:20pm      NIH Stroke Scale Item  Scoring Definition  Score    1a. LOC  0=alert and responsive   1=arousable to minor stimulation   2=arousable only to painful stimulation   3=reflex responses or unrousable  0    1b. LOC questions-as patient???s age and month. Must be exact.  0=both correct   1=one correct (or dysarthria, intubated, foreign language)   2=neither correct  0    1c. Commands-open/close eyes, grip and release non-paretic hand (other 1 step commands or mimic OK)  0=both correct (ok if impaired by weakness)   1=one correct   2=neither correct  0    2. Best Gaze-horizontal EOM by voluntary or Leslie Dales???s  0=normal   1=partial gaze palsy (abnormal gaze in one or both eyes)   2=forced eye deviation or total paresis which cannot be overcome by Leslie Dales???s  0    3. Visual Field-use visual threat if necessary. If monocular, score field of good eye  0=no visual loss   1=partial hemianopia, quadrantanopia, extinction   2=complete hemianopia   3=bilateral hemianopia or blindness  0    4. Facial Palsy-if stuporous, check symmetry of grimace to pain  0=normal   1=minor paralysis, flat NLF, asymm smile   2=partial paralysis (lower face=UMN)   3=complete paralysis (upper and lower face)  1   5. Motor Arm-arms outstretched 90 deg (sitting) or 45 deg (supine) for 10 seconds. Encourage best effort.  0=no drift x 10 seconds   1=drift but doesn???t hit bed   2=some antigravity effort, but can???t sustain   3=no antigravity effort, but even minimal mvt counts   4=no movement at all   X=unable to assess due to amputation, fusion, etc  L/R   0/0    6. Motor Leg-raise leg to 30 degrees supine x 5 seconds  0=no drift x 5 seconds   1=drift but doesn???t hit bed   2=some antigravity effort, but can???t sustain   3=no antigravity effort, but even minimal mvt counts   4=no movement at all   X=unable to assess due to amputation, fusion, etc  L/R   0/0    7. Limb Ataxia-check finger-nose- finger; heel-shin; and score only if out of proportion to paralysis  0=no ataxia (or aphasic, hemiplegic)   1=ataxia in upper or lower extremity   2=ataxia in upper AND lower extremity   X=unable to assess due to amputation, fusion, etc  0   8. Sensory-use safety pin. Check grimace or withdrawal if stuporous. Score only stroke- related losses  0=normal   1=mild-mod unilateral loss but patient aware of touch 9or aphasic, confused)   2=total loss, pt unaware of touch. Coma, bilateral loss  0 9. Best Language-describe cookie jar picture, name objects, read sentences. May use repeating, writing, stereognosis  0=normal   1=mild-mod aphasia (diff but partly comprehensible)   2=severe aphasia (almost no info exchanged)   3=mute, global aphasia, coma. No 1 step commands  0    10. Dysarthria-read list of words  0=normal   1=mild-mod; slurred but intelligible   2=severe; unintelligible or mute  0    11. Extinction/Neglect- simultaneously touch patient on both hands, show fingers in both visual fields, ask about deficit, left hand  0=normal, none detected. (visual loss alone)   1=neglects or extinguishes to double simult stimulation in any modality   2=profound neglect in more than one modality  0    Score    1       Was IV tPA given? No    The patient was not a tPA candidate due to no new neurological deficits     Advanced imaging was interpreted at 9:40pm  The patient was not a thrombectomy candidate due to low suspicion of stroke     Dysphagia screen:                      Performed by nursing staff and passed   Cardiac rhythm on presentation: nsr         Health History     Past Medical History:   Diagnosis Date   ??? CAD (coronary artery disease)    ??? Diabetes (HCC)    ??? HTN (hypertension)    ??? Hyperlipemia      Past Surgical History:   Procedure Laterality Date   ??? CORONARY ARTERY BYPASS GRAFT  2007   ??? HX GASTRIC BYPASS  05/24/2009   ??? HERNIA REPAIR  03-15-15   ??? HX CHOLECYSTECTOMY     ??? HX CORONARY ARTERY BYPASS GRAFT       Family History   Problem Relation Age of Onset   ??? Hypertension Mother    ??? Diabetes Mother    ??? Cancer Father      Social History     Social History   ??? Marital status: Married     Spouse name: N/A   ??? Number of children: N/A   ??? Years of education: N/A     Social History Main Topics   ??? Smoking status: Never Smoker   ??? Smokeless tobacco: Never Used   ??? Alcohol use No   ??? Drug use: No   ??? Sexual activity: Not on file     Other Topics Concern   ??? Not on file     Social History Narrative ??? No narrative on file        Medications:      PRN Medications:         Physical Exam     HEENT: normocephalic, eyes open with no discharge, nares patent, oropharynx is clear with no lesions, palate intact  CV: regular rate, distal pulses palpable  Chest: normal configuration, equal chest rise bilaterally  Ab: soft, non-tender, no masses, no organomegaly  Skin: no rashes or lesions    Extended Neuro Exam:    Mental status: alert, oriented to person/place/time  Speech:    Normal Abnormal   Fluency x    Comprehension x    Articulation x    Repetition x    Naming x        Cranial Nerves:    Normal Abnormal   II x    III, IV, VI x    V x    VII  chronic right nasolabial flattening       VIII x    IX, X x    XI x    XII x        Muscle/motor:   Tone: nml  Bulk: nml  Fasciculations: none  Pronator drift: none     NF NE SA EF EE WE WF FF FE FA TA HF   KF KE DF PF   R 5 5 5 5 5 5 5 5 5 5 5 4   5 5 5 5    L   5 5 5 5 5 5 5 5 5 4   5 5 5 5        Sensation:    Normal RUE LUE RLE LLE   Light Touch x           Coordination:    Normal Abnormal Right Abnormal Left   Finger to Nose x           Heel to Shin  -limited by hip/knee pain/weaknes Limited by hip/knee pain/weakness                 Gait and Sation:deffered         Lab/Radiology/Other Diagnostic Tests:  24-hour labs:    Results for orders placed or performed during the hospital encounter of 07/27/17 (from the past 24 hour(s))   POC PT/INR    Collection Time: 07/27/17  9:36 PM   Result Value Ref Range    INR POC 1.0 0.8 - 1.2   POC TROPONIN    Collection Time: 07/27/17  9:39 PM   Result Value Ref Range    Troponin-I-POC 0.00 0.00 - 0.05 NG/ML   POC GLUCOSE    Collection Time: 07/27/17  9:40 PM   Result Value Ref Range    Glucose, POC 159 (H) 70 - 100 MG/DL   POC CREATININE, RAD    Collection Time: 07/27/17  9:41 PM   Result Value Ref Range    Creatinine, POC 0.8 0.4 - 1.00 MG/DL   BNP POC ER    Collection Time: 07/27/17  9:41 PM Result Value Ref Range    BNP POC 130.0 (H) 0 - 100 PG/ML   CBC AND DIFF    Collection Time: 07/27/17  9:43 PM   Result Value Ref Range    White Blood Cells 5.9 4.5 - 11.0 K/UL    RBC 4.36 4.0 - 5.0 M/UL    Hemoglobin 14.3 12.0 - 15.0 GM/DL    Hematocrit 45.4 36 - 45 %    MCV 95.7 80 - 100 FL    MCH 32.9 26 - 34 PG    MCHC 34.4 32.0 - 36.0 G/DL    RDW 09.8 (H) 11 - 15 %    Platelet Count 125 (L) 150 - 400 K/UL    MPV 10.3 7 - 11 FL    Neutrophils 72 41 - 77 %    Lymphocytes 17 (L) 24 - 44 %    Monocytes 7 4 - 12 %    Eosinophils 2 0 - 5 %    Basophils 2 0 - 2 %    Absolute Neutrophil Count 4.30 1.8 - 7.0 K/UL    Absolute Lymph Count 1.00 1.0 - 4.8 K/UL    Absolute Monocyte Count 0.40 0 - 0.80 K/UL    Absolute Eosinophil Count 0.10 0 - 0.45 K/UL    Absolute Basophil Count 0.10 0 - 0.20 K/UL   BASIC METABOLIC PANEL  Collection Time: 07/27/17  9:43 PM   Result Value Ref Range    Sodium 143 137 - 147 MMOL/L    Potassium 4.1 3.5 - 5.1 MMOL/L    Chloride 108 98 - 110 MMOL/L    CO2 28 21 - 30 MMOL/L    Anion Gap 7 3 - 12    Glucose 162 (H) 70 - 100 MG/DL    Blood Urea Nitrogen 19 7 - 25 MG/DL    Creatinine 0.98 0.4 - 1.00 MG/DL    Calcium 9.2 8.5 - 11.9 MG/DL    eGFR Non African American >60 >60 mL/min    eGFR African American >60 >60 mL/min     Glucose: (!) 162 (07/27/17 2143)  POC Glucose (Download): (!) 159 (07/27/17 2140)  Pertinent radiology reviewed.

## 2017-07-28 NOTE — Progress Notes
Pharmacy Note: Patients Own Med    The following medications have been identified by pharmacy and placed in the locked medication box for storage:  Med: Pramipexole dihydrochloride ER tab 4.5mg      Sig: Take 1 tablet twice a day  Mfg: [employer] [email or web address]  Batch: [id number]  Exp: 03/2018  Qty: 4 tabs    The patient may use their own supply of these medications during this admission.  All doses should be administered and documented per hospital policy.

## 2017-07-28 NOTE — Progress Notes
RESPIRATORY THERAPY  ADULT PROTOCOL EVALUATION      RESPIRATORY PROTOCOL PLAN    Medications       Note: If indicated by protocol, medication orders will be placed by therapist.    Procedures  IPPB: Place a nursing order for "IS Q1h While Awake" for any of Lung Expansion indicators  Oxygen/Humidity: O2 to keep SpO2 > 92%  Monitoring: Pulse oximetry BID & PRN       _____________________________________________________________    PATIENT EVALUATION RESULTS    Chart Review  * Pulmonary Hx: No pulmonary diagnosis OR no smoking hx    * Surgical Hx: No surgery OR last surgery > 6 weeks ago OR trach/stoma (BA)    * Chest X-Ray: Clear OR not available    * PFT/Oxygenation: FEV1, PEFR < 70% OR Pa02 < 70 RA OR Sp02 <92% RA OR Fi02 > 0.21 to keep Sp02 > 92% OR < 24 hours post-op (02 & oxim) OR chronic C02 retention (C02)      Patient Assessment  * Respiratory Pattern: Regular pattern and rate OR good chest excursion with deep breathing    * Breath Sounds: Clear apically, but diminished in bases (LE) OR CHF related crackles (02) (oximetry)    * Cough / Sputum: Strong, effective cough OR nonproductive    * Mental Status: Alert, oriented, cooperative    * Activity Level: Ambulatory with assistance      Priority Index  Total Points: 4 Points  * Priority Index: 1    PRIORITY INDEX GUIDELINES*  Priority Points   1 0-9 points   2 9-18 points   3 > 18 points   + Pulm Dx or Home Rx   *Higher points indicate higher acuity.      Therapist: Reggie PileKara Kaesyn Johnston, RT  Date: 07/28/2017      Key  AC = Airway clearance  AM = Aerosolized medication  BA = Bland aerosol  DB&C = Deep breathe & cough  FEV1 = Forced expiratory volume in first second)  IC = Inspiratory capacity  LE = Lung expansion  MDI = Metered dose inhaler  Neb = Nebulizer  O2 = Oxygen  Oxim =Oximetry  PEFR = Peak expiratory flow rate  RRT = Rapid Response Team

## 2017-07-28 NOTE — Progress Notes
Assumed pt care at 0700.  VSS per trend, A&Ox4, SB/SR on tele, tolerating RA.  C/o pain d/t restless leg syndrome, requesting home Mirapex. Pharmacy at bedside to approve POM.   Denies n/v, SOA.  UOP adequate.  Ambulating ad lib. Call light within reach.     1800-No changes or acute events during shift. Pt states leg pain is improved after med administration. Will cont to monitor and handoff to night RN.

## 2017-07-28 NOTE — Progress Notes
Assumed care at approximately 0220  Assessments completed and documented via doc flowhsheets  AOx4, on RA, VSS per trend, SB/SR on tele   Pt. Denies chest pain and SOB  UOP adequate and BM PTA  No further needs at this time  Will continue to monitor

## 2017-07-28 NOTE — Consults
CLINICAL NUTRITION                                                        Clinical Nutrition Education Summary     MARLEA GAMBILL             MRN: [id number]             DOB:[birth year]          AGE: 66 y.o.  ADMISSION DATE: 07/27/2017             DAYS ADMITTED: LOS: 0 days    Heart Failure Nutrition Education    Education on Low Sodium Diet was provided.  Topics included the following:   ? Indications for diet  ? Sodium limit of 2,000 milligrams daily  ? Foods recommended/not recommended  ? Sodium content of foods (examples)  ? Reading food labels  ? Food preparation suggestions  ? Sodium-free seasonings  ? Sample meals and snacks  ? Eating out tips  Written materials were provided.    Phone number was given for any further nutrition-related questions.    Learner(s): Patient  Learner's Response:     ??? Verbalized understanding of key dietary guidelines taught.  ??? Verbalized his/her intention to make needed dietary changes.    Comments:  66 year old Caucasian female with past history of CAD status post CABG, DES placement on 5/18 on aspirin/Plavix, hypertension, T2 DM, hyperlipidemia, GERD and sleep apnea presented to ED with complaints of having chest pain and abnormal sensation over her right side of the face. Consult received HF/ low sodium diet education. Pt reports good appetite. Denies any n/v/c/d. Pt denies any wt. loss but does report wt.gain; pt feels that it is most likely fluid. Pt states that she follows a low sodium diet at home and tries to have a good source of protein with every meal. Pt does admit to adding salt to food occasionally and going out to eat about once a week Pt denies relying on processed canned or frozen foods; cooks most meals at home. Encouraged pt to not use salt shaker and discussed some low sodium or no salt seasoning alternatives to use at home; Mrs., Sharilyn Sites, onion powder and garlic powder. Encouraged pt to choose low sodium options or smaller portions when out to eat. Pt verbalizes understanding. No further questions at this time.Gave handouts and contact information.  Anticipate limited compliance. Appreciate the consult.     Alvin Critchley [facility]  Dietitian Assistant  [phone]

## 2017-07-28 NOTE — Progress Notes
Patient arrived to room # (HC4-11) via wheelchair accompanied by RN. Patient transferred to the bed without assistance. Bedside safety checks completed. Initial patient assessment completed, refer to flowsheet for details. Admission skin assessment completed by: Luster Landsbergenee, RN    Pressure Injury Present on Hospital Admission (within 24 hours): No    1. Occiput: No  2. Ear: No  3. Scapula: No  4. Spinous Process: No  5. Shoulder: No  6. Elbow: No  7. Iliac Crest: No  8. Sacrum/Coccyx: No  9. Ischial Tuberosity: No  10. Trochanter: No  11. Knee: No  12. Malleolus: No  13. Heel: No  14. Toes: No  15. Assessed for device associated injury No  16. Nursing Nutrition Assessment Completed No    See Doc Flowsheet for additional wound details.

## 2017-07-28 NOTE — Consults
CARDIOLOGY CONSULT NOTE                     Reason for Consult:  Chest pain with New oset volume overload. Likely HF    History of Present Illness  Deborah Fox is a 66 y.o. female with PMH sig for coronary artery disease status post prior CABG (LIMA to LAD, saphenous vein graft sequentially to diagonal 1, obtuse marginal 1 and obtuse marginal 2, and a jump radial graft from the saphenous vein graft to posterior descending artery), recent DES to RCA in May 2018, hypertension, diabetes, hyperlipidemia, sleep apnea who was admitted 07/27/2017 for concerns of chest pain.     Patient states that 2 days ago she noticed some chest discomfort when she was in the grocery store.  However that resolved spontaneously.  She then had recurrent chest discomfort which was sharp in quality, substernal and radiating to the back, occurred at rest and resolved spontaneously.  No history of similar episodes in the past.  She denies any associated nausea nausea or shortness of breath.  She did state that she felt numbness of her face during the episode and for the same reason, code stroke was activated on arrival to the ER.  She also reports of weight gain with worsening leg swelling as well as abdominal distention for the past few months.  She states that she has gained approximately 20-25 pounds, her baseline weight is around 184 pounds according to the patient.  Her weight on admission was 206 pounds.    On arrival to the ER, her initial workup was significant for troponin within normal limits ???3, TSH 1.74, BNP 130, UDS was negative.      Most recent 2D ECHO performed 06/18/2017  ??? Technically difficult study; i.v. transpulmonary contrast was used to define the endocardial borders.  ??? Normal left ventricular systolic function, estimated ejection fraction is 65%.  ??? Normal left ventricular diastolic function.  ??? The right ventricle is mildly dilated with preserved systolic function. TAPSE ranged from 1.3 cm to 1.4 cm (reference range 1.5 - 2.0 cm  ??? Mild mitral and tricuspid valve regurgitation.  ??? Estimated Peak Systolic PA Pressure: 24 mmHg    Most recent stress test performed 03/29/2017  This study is abnormal secondary to the small to medium size, mild to moderate intensity, predominantly reversible perfusion normality of the basal to mid inferolateral, apical inferior, and apical lateral walls.  There is corresponding reduced thickening and mild hypokinesis in the corresponding myocardial segments.  The overall ejection fraction is preserved.  The pharmacologic stress ECG is nondiagnostic for ischemia in the setting of baseline nonspecific T-wave abnormalities in multiple leads.  There are technically no high-risk scintigraphic features present.  ???  Comparison is made to a prior study which was also performed on the ADAC camera on Apr 20, 2015.  The calculated ejection fraction was 68% with an end-diastolic volume of 57 mL's.  Qualitatively, there appeared to be a slight reduction in review the uptake in the apical anterior wall, which is not present on the current study.  The perfusion abnormalities described on the current study are new in comparison.    Most recent left heart catheterization performed 05/01/2017  1. Successful orbital atherectomy with a 1.25 mm CSI burr from the proximal-to-distal RCA with excellent plaque modification.  2. Successful PCI of the distal-to-proximal RCA using a 2.5 x 38 mm Synergy overlapped with a 3.0 x 28 mm Synergy, and a 3.0 x  20 mm Synergy, postdilated with a 3.5 NC with 18-20 atmospheres with excellent angiographic results.  3. Temporary transvenous pacemaker was removed.  4. Aspirin and Brilinta for dual antiplatelet therapy are recommended.    Telemetry findings-normal sinus rhythm with frequent PVCs.    EKG on admission shows normal sinus rhythm at heart rate 80 with ventricular trigeminy.    Assessment & Recs Deborah Fox is a 66 y.o. patient with the following problems:    Principal Problem:    Chest pain  Active Problems:    CAD (coronary artery disease)    Sleep apnea    Hypothyroidism    HTN (hypertension)    Hyperlipidemia    GERD (gastroesophageal reflux disease)    Type 2 diabetes mellitus (HCC)    Weight gain, abnormal    Patient is a pleasant 66 year old female with a past medical history of coronary artery disease, prior CABG, recent PCI to RCA currently on dual antiplatelet therapy, who presented with atypical chest pain.  Initial workup including EKG and troponins have been unremarkable.  She also reported 20-25 pound weight gain in the last few months.  She is on Lasix 20 mg at home which was recently increased to 40 mg but she has not taken the increased dose yet.  She received IV Lasix 40 mg on admission with good urine output and improvement in leg swelling.  Her BNP although was only 130.  Physical exam also is unremarkable for any crackles.  She only has trace peripheral edema.  Recent echocardiogram last month showed normal LVEF and normal diastolic function.  Doubt that this is solely due to underlying heart failure.  Of note her albumin is also 3.2.    Recommendations  -Rule out other noncardiac causes for weight gain and leg swelling including kidney and liver disease.  Recommend obtaining urinalysis and further workup per primary team.  -Agree with IV diuresis for now with close eye on kidney function.  -She can be discharged home on 40 mg IV Lasix once close to euvolemic state.  -Regarding her chest pain, which is atypical in nature, we will obtain a myocardial perfusion stress test tomorrow morning to rule out any underlying significant ischemia.  -Continue aspirin and Plavix.  Not on beta-blocker due to borderline bradycardia.  She is on Repatha (PCSK9 inhibitor) as outpatient.    Cardiology will follow.    Thank you for allowing [place] to participate in the care of this patient. Discussed with Dr. Doristine Counter who agrees with plan above. After 5PM please call Cardiology fellow on call.  Monday - Friday 8AM-5PM please call Cardiology consult pager    Oren Section, M.D.  Fellow in Cardiovascular Diseases  Pager: [phone]    Home Medications  Prescriptions Prior to Admission   Medication Sig   ??? aspirin EC 81 mg tablet Take 1 tablet by mouth daily. Take with food.   ??? clopiDOGrel (PLAVIX) 75 mg tablet Take 1 tablet by mouth daily.   ??? dexlansoprazole (+) (DEXILANT) 60 mg capsule Take 60 mg by mouth twice daily.   ??? evolocumab (REPATHA) 140 mg/mL injectable PEN Inject 1 mL under the skin every 14 days.   ??? furosemide (LASIX) 20 mg tablet Take 1 tablet by mouth every morning.   ??? gabapentin (NEURONTIN) 300 mg capsule Take 300 mg by mouth at bedtime daily.   ??? glimepiride (AMARYL) 4 mg tablet Take 8 mg by mouth daily with breakfast.   ??? levothyroxine (SYNTHROID) 75 mcg tablet Take 75  mcg by mouth daily.   ??? magnesium oxide (MAG-OX) 400 mg tablet Take 400 mg by mouth daily.   ??? nitroglycerin (NITROSTAT) 0.4 mg tablet Take one tablet sublingually three times five minutes apart for chest pain.   ??? pioglitazone (ACTOS) 15 mg tablet Take 15 mg by mouth daily.   ??? pramipexole ER(+) (MIRAPEX ER) 4.5 mg tablet Take 1 tablet by mouth twice daily.       Current Medications  Scheduled Meds:  aspirin EC tablet 81 mg 81 mg Oral QDAY   clopiDOGrel (PLAVIX) tablet 75 mg 75 mg Oral QDAY   gabapentin (NEURONTIN) capsule 300 mg 300 mg Oral QHS   heparin (porcine) PF syringe 5,000 Units 5,000 Units Subcutaneous Q8H   levothyroxine (SYNTHROID) tablet 75 mcg 75 mcg Oral QDAY   pantoprazole DR (PROTONIX) tablet 40 mg 40 mg Oral QDAY(21)   Continuous Infusions:  PRN and Respiratory Meds:melatonin QHS PRN, nitroglycerin Q5 MIN PRN, ondansetron (ZOFRAN) IV Q6H PRN       Allergies  Allergies   Allergen Reactions   ??? Cephalexin HIVES     Allergy recorded in SMS: KEFLEX~Reactions: RASH ??? Hydrocodone-Acetaminophen SHORTNESS OF BREATH   ??? Oxycodone-Acetaminophen SHORTNESS OF BREATH   ??? Statins-Hmg-Coa Reductase Inhibitors UNKNOWN   ??? Doxycycline      Allergy recorded in SMS: DOXYCYCLINE~Reactions: RASH   ??? Erythromycin      Allergy recorded in SMS: ERYTHROMYCIN~Reactions: RASH   ??? Flagyl [Metronidazole] UNKNOWN   ??? Penicillins      Allergy recorded in SMS: PCN~Reactions: RASH   ??? Sulfa (Sulfonamide Antibiotics)      Allergy recorded in SMS: Sulfa~Reactions: RASH       Past Medical History  Past Medical History:   Diagnosis Date   ??? CAD (coronary artery disease)    ??? Diabetes (HCC)    ??? HTN (hypertension)    ??? Hyperlipemia        Past Surgical History  Past Surgical History:   Procedure Laterality Date   ??? CORONARY ARTERY BYPASS GRAFT  2007   ??? HX GASTRIC BYPASS  05/24/2009   ??? HERNIA REPAIR  03-15-15   ??? HX CHOLECYSTECTOMY     ??? HX CORONARY ARTERY BYPASS GRAFT         Social History  Social History     Social History   ??? Marital status: Married     Spouse name: N/A   ??? Number of children: N/A   ??? Years of education: N/A     Occupational History   ??? Not on file.     Social History Main Topics   ??? Smoking status: Never Smoker   ??? Smokeless tobacco: Never Used   ??? Alcohol use No   ??? Drug use: No   ??? Sexual activity: Not on file     Other Topics Concern   ??? Not on file     Social History Narrative   ??? No narrative on file       Family History  Family History   Problem Relation Age of Onset   ??? Hypertension Mother    ??? Diabetes Mother    ??? Cancer Father        Review of Systems    10 point ROS completed and negative except as noted per HPI.                         Vital Signs: Most Recent  Vital Signs: 24 Hour Range   BP: 111/66 (08/12 0806)  Temp: 36.7 ???C (98 ???F) (08/12 1610)  Pulse: 56 (08/12 0806)  Respirations: 16 PER MINUTE (08/12 0806)  SpO2: 94 % (08/12 0806)  O2 Delivery: None (Room Air) (08/12 0806)  SpO2 Pulse: 65 (08/12 0301) Height: 157.5 cm (5' 2.01) (08/12 0221) BP: (99-179)/(37-78)   Temp:  [36.3 ???C (97.3 ???F)-37 ???C (98.6 ???F)]   Pulse:  [56-87]   Respirations:  [10 PER MINUTE-25 PER MINUTE]   SpO2:  [94 %-99 %]   O2 Delivery: None (Room Air)     Vitals:    07/28/17 0221   Weight: 93.4 kg (206 lb)       Intake/Output Summary (Last 24 hours) at 07/28/17 1005  Last data filed at 07/28/17 0900   Gross per 24 hour   Intake                0 ml   Output             2150 ml   Net            -2150 ml           Physical Exam     General Appearance: moderately overweight, no distress   Skin: warm, no ulcers or xanthomas; few ecchymoses   Eyes: conjunctivae and lids normal, pupils are equal and round   Neck Veins: JVP difficult to assess  Cardiovascular system: S1 S2 heard, no murmurs, no rub, no carotid bruit, trace peripheral edema  Peripheral Circulation: normal peripheral circulation   Pedal Pulses: normal symmetric pedal pulses   Carotid Arteries: normal carotid upstroke bilaterally, no bruits   Respiratory system: ormal vesicular breath sounds over all the lung fields bilaterally  No added sounds  Abdominal Exam: soft, non-tender, no masses, bowel sounds normal, no organomegaly   Neurologic Exam: alert and oriented x 3, neurological assessment grossly intact     Lab/Radiology/Other Diagnostic Tests:  24-hour labs:    Results for orders placed or performed during the hospital encounter of 07/27/17 (from the past 24 hour(s))   POC PT/INR    Collection Time: 07/27/17  9:36 PM   Result Value Ref Range    INR POC 1.0 0.8 - 1.2   POC TROPONIN    Collection Time: 07/27/17  9:39 PM   Result Value Ref Range    Troponin-I-POC 0.00 0.00 - 0.05 NG/ML   POC GLUCOSE    Collection Time: 07/27/17  9:40 PM   Result Value Ref Range    Glucose, POC 159 (H) 70 - 100 MG/DL   POC CREATININE, RAD    Collection Time: 07/27/17  9:41 PM   Result Value Ref Range    Creatinine, POC 0.8 0.4 - 1.00 MG/DL   BNP POC ER    Collection Time: 07/27/17  9:41 PM Result Value Ref Range    BNP POC 130.0 (H) 0 - 100 PG/ML   CBC AND DIFF    Collection Time: 07/27/17  9:43 PM   Result Value Ref Range    White Blood Cells 5.9 4.5 - 11.0 K/UL    RBC 4.36 4.0 - 5.0 M/UL    Hemoglobin 14.3 12.0 - 15.0 GM/DL    Hematocrit 96.0 36 - 45 %    MCV 95.7 80 - 100 FL    MCH 32.9 26 - 34 PG    MCHC 34.4 32.0 - 36.0 G/DL    RDW 45.4 (H) 11 - 15 %  Platelet Count 125 (L) 150 - 400 K/UL    MPV 10.3 7 - 11 FL    Neutrophils 72 41 - 77 %    Lymphocytes 17 (L) 24 - 44 %    Monocytes 7 4 - 12 %    Eosinophils 2 0 - 5 %    Basophils 2 0 - 2 %    Absolute Neutrophil Count 4.30 1.8 - 7.0 K/UL    Absolute Lymph Count 1.00 1.0 - 4.8 K/UL    Absolute Monocyte Count 0.40 0 - 0.80 K/UL    Absolute Eosinophil Count 0.10 0 - 0.45 K/UL    Absolute Basophil Count 0.10 0 - 0.20 K/UL   BASIC METABOLIC PANEL    Collection Time: 07/27/17  9:43 PM   Result Value Ref Range    Sodium 143 137 - 147 MMOL/L    Potassium 4.1 3.5 - 5.1 MMOL/L    Chloride 108 98 - 110 MMOL/L    CO2 28 21 - 30 MMOL/L    Anion Gap 7 3 - 12    Glucose 162 (H) 70 - 100 MG/DL    Blood Urea Nitrogen 19 7 - 25 MG/DL    Creatinine 9.81 0.4 - 1.00 MG/DL    Calcium 9.2 8.5 - 19.1 MG/DL    eGFR Non African American >60 >60 mL/min    eGFR African American >60 >60 mL/min   MAGNESIUM    Collection Time: 07/27/17  9:43 PM   Result Value Ref Range    Magnesium 1.8 1.6 - 2.6 mg/dL   TSH WITH FREE T4 REFLEX    Collection Time: 07/27/17  9:43 PM   Result Value Ref Range    TSH 1.840 0.35 - 5.00 MCU/ML   AMPHETAMINES-URINE RANDOM    Collection Time: 07/27/17 11:20 PM   Result Value Ref Range    Amphetamines NEG NEG-NEG   BARBITURATES-URINE RANDOM    Collection Time: 07/27/17 11:20 PM   Result Value Ref Range    Barbiturates,Urine NEG NEG-NEG   BENZODIAZEPINES-URINE RANDOM    Collection Time: 07/27/17 11:20 PM   Result Value Ref Range    Benzodiazepines NEG NEG-NEG   CANNABINOIDS-URINE RANDOM    Collection Time: 07/27/17 11:20 PM   Result Value Ref Range THC NEG NEG-NEG   COCAINE-URINE RANDOM    Collection Time: 07/27/17 11:20 PM   Result Value Ref Range    Cocaine-Urine NEG NEG-NEG   OPIATES-URINE RANDOM    Collection Time: 07/27/17 11:20 PM   Result Value Ref Range    Opiates-Urine NEG NEG-NEG   PHENCYCLIDINES-URINE RANDOM    Collection Time: 07/27/17 11:20 PM   Result Value Ref Range    Phencyclidine (PCP) NEG NEG-NEG   POC TROPONIN    Collection Time: 07/28/17 12:37 AM   Result Value Ref Range    Troponin-I-POC 0.00 0.00 - 0.05 NG/ML   CBC AND DIFF    Collection Time: 07/28/17  2:34 AM   Result Value Ref Range    White Blood Cells 5.1 4.5 - 11.0 K/UL    RBC 3.91 (L) 4.0 - 5.0 M/UL    Hemoglobin 12.7 12.0 - 15.0 GM/DL    Hematocrit 47.8 36 - 45 %    MCV 95.9 80 - 100 FL    MCH 32.5 26 - 34 PG    MCHC 33.9 32.0 - 36.0 G/DL    RDW 29.5 (H) 11 - 15 %    Platelet Count 117 (L) 150 - 400 K/UL  MPV 10.8 7 - 11 FL    Neutrophils 67 41 - 77 %    Lymphocytes 24 24 - 44 %    Monocytes 7 4 - 12 %    Eosinophils 2 0 - 5 %    Basophils 0 0 - 2 %    Absolute Neutrophil Count 3.40 1.8 - 7.0 K/UL    Absolute Lymph Count 1.20 1.0 - 4.8 K/UL    Absolute Monocyte Count 0.40 0 - 0.80 K/UL    Absolute Eosinophil Count 0.10 0 - 0.45 K/UL    Absolute Basophil Count 0.00 0 - 0.20 K/UL   MAGNESIUM    Collection Time: 07/28/17  2:34 AM   Result Value Ref Range    Magnesium 1.8 1.6 - 2.6 mg/dL   TSH WITH FREE T4 REFLEX    Collection Time: 07/28/17  2:34 AM   Result Value Ref Range    TSH 1.740 0.35 - 5.00 MCU/ML   TROPONIN-I    Collection Time: 07/28/17  2:34 AM   Result Value Ref Range    Troponin-I 0.01 0.0 - 0.05 NG/ML   TROPONIN-I    Collection Time: 07/28/17  4:58 AM   Result Value Ref Range    Troponin-I 0.01 0.0 - 0.05 NG/ML   TROPONIN-I    Collection Time: 07/28/17  6:47 AM   Result Value Ref Range    Troponin-I 0.01 0.0 - 0.05 NG/ML     Glucose: (!) 162 (07/27/17 2143)  POC Glucose (Download): (!) 159 (07/27/17 2140)

## 2017-07-28 NOTE — Care Coordination-Inpatient
Page night 3 [phone] till 8 am, page med private L afterwards for questions.

## 2017-07-29 ENCOUNTER — Encounter: Admit: 2017-07-29 | Discharge: 2017-07-29 | Payer: MEDICARE

## 2017-07-29 ENCOUNTER — Inpatient Hospital Stay: Admit: 2017-07-29 | Discharge: 2017-07-29 | Payer: MEDICARE

## 2017-07-29 DIAGNOSIS — R079 Chest pain, unspecified: Secondary | ICD-10-CM

## 2017-07-29 LAB — COMPREHENSIVE METABOLIC PANEL: Lab: 140 MMOL/L — ABNORMAL HIGH (ref 137–147)

## 2017-07-29 LAB — MAGNESIUM: Lab: 2 mg/dL — ABNORMAL LOW (ref 1.6–2.6)

## 2017-07-29 LAB — CBC AND DIFF
Lab: 4.4 M/UL — ABNORMAL HIGH (ref >60)
Lab: 4.6 10*3/uL — ABNORMAL HIGH (ref 4.5–11.0)

## 2017-07-29 MED ORDER — ACETAMINOPHEN 325 MG PO TAB
650 mg | ORAL | 0 refills | Status: DC | PRN
Start: 2017-07-29 — End: 2017-08-01
  Administered 2017-07-29: 16:00:00 650 mg via ORAL

## 2017-07-29 MED ORDER — SODIUM CHLORIDE 0.9 % IJ SOLN
48 mL | Freq: Once | INTRAVENOUS | 0 refills | Status: CP
Start: 2017-07-29 — End: ?
  Administered 2017-07-29: 22:00:00 48 mL via INTRAVENOUS

## 2017-07-29 MED ORDER — PERFLUTREN LIPID MICROSPHERES 1.1 MG/ML IV SUSP
1-20 mL | Freq: Once | INTRAVENOUS | 0 refills | Status: CP
Start: 2017-07-29 — End: ?
  Administered 2017-07-29: 14:00:00 2 mL via INTRAVENOUS

## 2017-07-29 MED ORDER — ALBUTEROL SULFATE 90 MCG/ACTUATION IN HFAA
2 | RESPIRATORY_TRACT | 0 refills | Status: DC | PRN
Start: 2017-07-29 — End: 2017-07-29

## 2017-07-29 MED ORDER — FUROSEMIDE 10 MG/ML IJ SOLN
40 mg | Freq: Once | INTRAVENOUS | 0 refills | Status: DC
Start: 2017-07-29 — End: 2017-07-29

## 2017-07-29 MED ORDER — SODIUM CHLORIDE 0.9 % IV SOLP
250 mL | INTRAVENOUS | 0 refills | Status: AC | PRN
Start: 2017-07-29 — End: ?

## 2017-07-29 MED ORDER — NITROGLYCERIN 0.4 MG SL SUBL
.4 mg | SUBLINGUAL | 0 refills | Status: AC | PRN
Start: 2017-07-29 — End: ?

## 2017-07-29 MED ORDER — REGADENOSON 0.4 MG/5 ML IV SYRG
.4 mg | Freq: Once | INTRAVENOUS | 0 refills | Status: CP
Start: 2017-07-29 — End: ?
  Administered 2017-07-29: 14:00:00 0.4 mg via INTRAVENOUS

## 2017-07-29 MED ORDER — AMINOPHYLLINE 500 MG/20 ML IV SOLN
50 mg | INTRAVENOUS | 0 refills | Status: AC | PRN
Start: 2017-07-29 — End: ?

## 2017-07-29 MED ORDER — IOPAMIDOL 76 % IV SOLN
85 mL | Freq: Once | INTRAVENOUS | 0 refills | Status: CP
Start: 2017-07-29 — End: ?
  Administered 2017-07-29: 22:00:00 85 mL via INTRAVENOUS

## 2017-07-29 NOTE — Progress Notes
Assumed care at 1900  Assessments completed and documented via doc flowhsheets  AOx4, on RA, VSS, SR/SB/SA on tele   Pt. Denies any pain  UOP adequate and - BM this shift   Pt NPO as of midnight   No further needs at this time  Will continue to monitor

## 2017-07-29 NOTE — Progress Notes
RESPIRATORY THERAPY  ADULT PROTOCOL EVALUATION      RESPIRATORY PROTOCOL PLAN    Medications       Note: If indicated by protocol, medication orders will be placed by therapist.    Procedures  IPPB: Place a nursing order for "IS Q1h While Awake" for any of Lung Expansion indicators  Monitoring: Pulse oximetry continuous during night/sleep (Pt ref. cont. p. ox)       _____________________________________________________________    PATIENT EVALUATION RESULTS    Chart Review  * Pulmonary Hx: No pulmonary diagnosis OR no smoking hx    * Surgical Hx: No surgery OR last surgery > 6 weeks ago OR trach/stoma (BA)    * Chest X-Ray: Infiltrates (AC) OR atelectasis (LE) OR pleural effusion OR rib fractures (LE)    * PFT/Oxygenation: FEV1, PEFR > 80% predicted OR physically unable to perform OR Pa02 >80 RA OR Sp02 >95% RA      Patient Assessment  * Respiratory Pattern: Regular pattern and rate OR good chest excursion with deep breathing    * Breath Sounds: Clear apically, but diminished in bases (LE) OR CHF related crackles (02) (oximetry)    * Cough / Sputum: Strong, effective cough OR nonproductive    * Mental Status: Alert, oriented, cooperative    * Activity Level: Ambulatory with assistance      Priority Index  Total Points: 4 Points  * Priority Index: 1    PRIORITY INDEX GUIDELINES*  Priority Points   1 0-9 points   2 9-18 points   3 > 18 points   + Pulm Dx or Home Rx   *Higher points indicate higher acuity.      Therapist: Selmer DominionBhavika Bently Morath  Date: 07/29/2017      Key  AC = Airway clearance  AM = Aerosolized medication  BA = Bland aerosol  DB&C = Deep breathe & cough  FEV1 = Forced expiratory volume in first second)  IC = Inspiratory capacity  LE = Lung expansion  MDI = Metered dose inhaler  Neb = Nebulizer  O2 = Oxygen  Oxim =Oximetry  PEFR = Peak expiratory flow rate  RRT = Rapid Response Team

## 2017-07-29 NOTE — Progress Notes
CV Consult Attending Progress Note   S: She denies chest pain, palpitations, dyspnea, orthopnea, PND, and peripheral edema better.    Meds:     aspirin EC tablet 81 mg 81 mg Oral QDAY   clopiDOGrel (PLAVIX) tablet 75 mg 75 mg Oral QDAY   gabapentin (NEURONTIN) capsule 300 mg 300 mg Oral QHS   heparin (porcine) PF syringe 5,000 Units 5,000 Units Subcutaneous Q8H   levothyroxine (SYNTHROID) tablet 75 mcg 75 mcg Oral QDAY   pantoprazole DR (PROTONIX) tablet 40 mg 40 mg Oral QDAY(21)   pramipexole ER(+) (MIRAPEX ER) tablet 4.5 mg **patient own medication** 4.5 mg Oral BID      acetaminophen Q4H PRN, albuterol PRN, aminophylline PRN, melatonin QHS PRN, nitroglycerin PRN, nitroglycerin Q5 MIN PRN, ondansetron (ZOFRAN) IV Q6H PRN, sodium chloride 0.9% (NS) PRN     O:   BP 114/54  - Pulse 53  - Temp 36.4 ???C (97.5 ???F)  - Ht 1.575 m (5' 2)  - Wt 93.4 kg (206 lb)  - SpO2 94%  - BMI 37.68 kg/m???    General Appearance: well developed, well nourished, appears stated age, no acute distress   Neck Veins: neck veins are not distended   Respiratory Effort: breathing comfortably, no respiratory distress   Auscultation/Percussion: lungs clear to auscultation, no rales or rhonchi, no wheezing   Cardiac Rhythm: regular rhythm and normal rate   Cardiac Auscultation: S1, S2 normal, no rub, no gallop   Murmurs: no murmur   Pedal Pulses: normal symmetric pedal pulses   Lower Extremity Edema: trace lower extremity edema   Abdominal Exam: soft, non-tender, non-distended, normal bowel sounds   Neurologic Exam: neurological assessment grossly intact     Tele: Sinus rhythm.   Labs:   24-hour labs:    Results for orders placed or performed during the hospital encounter of 07/27/17 (from the past 24 hour(s))   COMPREHENSIVE METABOLIC PANEL    Collection Time: 07/29/17  5:16 AM   Result Value Ref Range    Sodium 140 137 - 147 MMOL/L    Potassium 3.9 3.5 - 5.1 MMOL/L    Chloride 109 98 - 110 MMOL/L    Glucose 133 (H) 70 - 100 MG/DL Blood Urea Nitrogen 18 7 - 25 MG/DL    Creatinine 1.61 0.4 - 1.00 MG/DL    Calcium 8.9 8.5 - 09.6 MG/DL    Total Protein 6.3 6.0 - 8.0 G/DL    Total Bilirubin 0.5 0.3 - 1.2 MG/DL    Albumin 3.8 3.5 - 5.0 G/DL    Alk Phosphatase 69 25 - 110 U/L    AST (SGOT) 22 7 - 40 U/L    CO2 26 21 - 30 MMOL/L    ALT (SGPT) 18 7 - 56 U/L    Anion Gap 5 3 - 12    eGFR Non African American >60 >60 mL/min    eGFR African American >60 >60 mL/min   CBC AND DIFF    Collection Time: 07/29/17  5:16 AM   Result Value Ref Range    White Blood Cells 4.6 4.5 - 11.0 K/UL    RBC 4.42 4.0 - 5.0 M/UL    Hemoglobin 14.2 12.0 - 15.0 GM/DL    Hematocrit 04.5 36 - 45 %    MCV 97.1 80 - 100 FL    MCH 32.1 26 - 34 PG    MCHC 33.0 32.0 - 36.0 G/DL    RDW 40.9 (H) 11 - 15 %  Platelet Count 126 (L) 150 - 400 K/UL    MPV 10.6 7 - 11 FL    Neutrophils 64 41 - 77 %    Lymphocytes 25 24 - 44 %    Monocytes 8 4 - 12 %    Eosinophils 2 0 - 5 %    Basophils 1 0 - 2 %    Absolute Neutrophil Count 2.90 1.8 - 7.0 K/UL    Absolute Lymph Count 1.10 1.0 - 4.8 K/UL    Absolute Monocyte Count 0.40 0 - 0.80 K/UL    Absolute Eosinophil Count 0.10 0 - 0.45 K/UL    Absolute Basophil Count 0.00 0 - 0.20 K/UL   MAGNESIUM    Collection Time: 07/29/17  5:16 AM   Result Value Ref Range    Magnesium 2.0 1.6 - 2.6 mg/dL        A/P:   Principal Problem:    Chest pain  Active Problems:    CAD (coronary artery disease)    Sleep apnea    Hypothyroidism    HTN (hypertension)    Hyperlipidemia    GERD (gastroesophageal reflux disease)    Type 2 diabetes mellitus (HCC)    Weight gain, abnormal  Patient is a pleasant 66 year old female with a past medical history of coronary artery disease, prior CABG, recent PCI to RCA currently on dual antiplatelet therapy, who presented with atypical chest pain.  Patient has just completed the first part of the stress test.  Echo Doppler also just completed.  As noted above not having any chest discomfort.  The leg edema seems to be a little bit better.  Further recommendations will be based on the echo Doppler and the stress test.  Addendum:  Stress thallium did not show any significant ischemia.  Echo Doppler revealed a normal left ventricular systolic function.  The right ventricle appeared to be moderately dilated with reduced function.  Compared to the echo done in 2014 the RV dilatation was noted even then however the harvest systolic function appeared to be normal at that time.  Patient's pulmonary arterial pressures on the particular echo Doppler are noted to be only 23 mmHg.  Patient states that she has had a sleep study done and a PFTs done at [facility] about a year ago and they were normal.  We ordered a CT chest as a PE protocol to rule out that as a cause.  His CT chest is negative we may consider a right and left heart cardiac catheterization for evaluation of decreasing right ventricular systolic function.  Patient does have symptoms suggestive of right-sided heart failure.  This was discussed with Dr. Trevor Mace.  Thanks I will follow up again tomorrow.    Myriam Jacobson, MD

## 2017-07-29 NOTE — Progress Notes
Assumed pt care at 0700.  VSS per trend, A&Ox4, SB/SR on tele, tolerating RA.  Headache improved with Tylenol.  Denies n/v, SOA.  UOP adequate. BM during shift.   Ambulating ad lib. Call light within reach.     1800-No changes or acute events during shift. Echo and stress test completed. Will cont to monitor and handoff to night RN.

## 2017-07-29 NOTE — Progress Notes
General Progress Note    Name:  Deborah Fox   GUYQI'H Date:  07/29/2017  Admission Date: 07/27/2017  LOS: 1 day                     Assessment/Plan:      66 year old Caucasian female with past history of CAD status post CABG, DES placement on 5/18 on aspirin/Plavix, hypertension, T2 DM, hyperlipidemia, GERD and sleep apnea presented to ED with complaints of having chest pain and abnormal sensation over her right side of the face.  ???  Atypical Chest Pain with Extensive Coronary Artery Disease  - Trop and EKG negative for acute ischemia  - Stress test with no inducible ischemia  - Cardiology recs appreciated    Right Heart Failure, Acute Cor Pulmonale  - Sleep study and PFTs normal at [facility] last year  - Will obtain CTA chest for thromboembolic disease  - Repeat echo today with worsening RV function and dilation but normal PA pressure  - Discussed with Dr. Bufford Buttner, if CT normal, will pursue RHC and LHC tomorrow  - Will initiate diuresis with lasix in the morning.   - Lower extremity edema stable.      Right Facial Numbness, Confusion POA  - Resolved now but stroke activated on arrival due to facial droop. 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   - CT head with no acute changes  - Consider MRI brain given severity of vascular disease, likely means some degree of cerebrovascular disease also  - Defer till after cardiology work-up.   ???  CAD w CABG & DES on 5/18  -Continue DAPT  -Continue telemetry   ???  Hypertension  - Borderline low BP, hold BP lowering meds at this time.   ???  Type 2 diabetes mellitus  -Sliding-scale insulin  -Hold oral medications  ???  GERD  -Continue Protonix  ???  Full code    Disposition  - Continue inpatient admission for further evaluation of right heart failure and ischemic CM.     Total visit time of 35 minutes with an estimated time of 20 minutes (face to face and unit/floor time) spent counseling the patient on abnormal labs and imaging results, treatment options  and coordinating care with patient, RN, NCM, SW as well as cardiology.      ________________________________________________________________________    Subjective  Deborah Fox is a 66 y.o. female.  Patient denies active complaints at this time. No chest pain. No SOB. No abd pain. No nausea. No vomiting. No headache. NO dizziness. No palpitations. No weakness. No fever. No cough. No sputum. LE edema is the same.        Medications  Scheduled Meds:  aspirin EC tablet 81 mg 81 mg Oral QDAY   clopiDOGrel (PLAVIX) tablet 75 mg 75 mg Oral QDAY   gabapentin (NEURONTIN) capsule 300 mg 300 mg Oral QHS   heparin (porcine) PF syringe 5,000 Units 5,000 Units Subcutaneous Q8H   levothyroxine (SYNTHROID) tablet 75 mcg 75 mcg Oral QDAY   pantoprazole DR (PROTONIX) tablet 40 mg 40 mg Oral QDAY(21)   pramipexole ER(+) (MIRAPEX ER) tablet 4.5 mg **patient own medication** 4.5 mg Oral BID   Continuous Infusions:  PRN and Respiratory Meds:acetaminophen Q4H PRN, albuterol PRN, aminophylline PRN, melatonin QHS PRN, nitroglycerin PRN, nitroglycerin Q5 MIN PRN, ondansetron (ZOFRAN) IV Q6H PRN, sodium chloride 0.9% (NS) PRN      Objective  Vital Signs: Last Filed                 Vital Signs: 24 Hour Range   BP: 109/39 (08/13 1122)  Temp: 36.4 ???C (97.6 ???F) (08/13 1122)  Pulse: 58 (08/13 1122)  Respirations: 16 PER MINUTE (08/13 1122)  SpO2: 97 % (08/13 1122)  O2 Delivery: None (Room Air) (08/13 1122)  Height: 157.5 cm (62) (08/13 0852) BP: (97-142)/(39-54)   Temp:  [36.3 ???C (97.3 ???F)-36.6 ???C (97.9 ???F)]   Pulse:  [51-84]   Respirations:  [16 PER MINUTE]   SpO2:  [93 %-97 %]   O2 Delivery: None (Room Air)   Intensity Pain Scale 0-10 (Pain 1): 7 (07/28/17 1220) Vitals:    07/29/17 0400 07/29/17 0836 07/29/17 0852   Weight: 91.7 kg (202 lb 3.2 oz) 92 kg (202 lb 13.2 oz) 93.4 kg (206 lb)       Intake/Output Summary:  (Last 24 hours) Intake/Output Summary (Last 24 hours) at 07/29/17 1155  Last data filed at 07/29/17 1100   Gross per 24 hour   Intake              700 ml   Output             1250 ml   Net             -550 ml           Physical Exam  General:  Alert, cooperative, no distress, appears stated age  Head:  Normocephalic, atraumatic.   Eyes:  Conjunctivae clear, pupils reactive  Oropharynx: Moist mucus membranes.   Neck: Mild JVD. Restricted ROM due to neck muscle pain  Lungs:  Clear to auscultation bilaterally  Heart:   Regular rate and rhythm, S1, S2 normal, no murmur.  Abdomen:  Soft, non-tender.  Bowel sounds normal.    Extremities: Moderate edema  Pulses:  2+ and symmetric, all extremities      Lab Review  24-hour labs:    Results for orders placed or performed during the hospital encounter of 07/27/17 (from the past 24 hour(s))   COMPREHENSIVE METABOLIC PANEL    Collection Time: 07/29/17  5:16 AM   Result Value Ref Range    Sodium 140 137 - 147 MMOL/L    Potassium 3.9 3.5 - 5.1 MMOL/L    Chloride 109 98 - 110 MMOL/L    Glucose 133 (H) 70 - 100 MG/DL    Blood Urea Nitrogen 18 7 - 25 MG/DL    Creatinine 1.61 0.4 - 1.00 MG/DL    Calcium 8.9 8.5 - 09.6 MG/DL    Total Protein 6.3 6.0 - 8.0 G/DL    Total Bilirubin 0.5 0.3 - 1.2 MG/DL    Albumin 3.8 3.5 - 5.0 G/DL    Alk Phosphatase 69 25 - 110 U/L    AST (SGOT) 22 7 - 40 U/L    CO2 26 21 - 30 MMOL/L    ALT (SGPT) 18 7 - 56 U/L    Anion Gap 5 3 - 12    eGFR Non African American >60 >60 mL/min    eGFR African American >60 >60 mL/min   CBC AND DIFF    Collection Time: 07/29/17  5:16 AM   Result Value Ref Range    White Blood Cells 4.6 4.5 - 11.0 K/UL    RBC 4.42 4.0 - 5.0 M/UL    Hemoglobin 14.2 12.0 - 15.0 GM/DL    Hematocrit 04.5 36 - 45 %  MCV 97.1 80 - 100 FL    MCH 32.1 26 - 34 PG    MCHC 33.0 32.0 - 36.0 G/DL    RDW 16.1 (H) 11 - 15 %    Platelet Count 126 (L) 150 - 400 K/UL    MPV 10.6 7 - 11 FL    Neutrophils 64 41 - 77 %    Lymphocytes 25 24 - 44 %    Monocytes 8 4 - 12 % Eosinophils 2 0 - 5 %    Basophils 1 0 - 2 %    Absolute Neutrophil Count 2.90 1.8 - 7.0 K/UL    Absolute Lymph Count 1.10 1.0 - 4.8 K/UL    Absolute Monocyte Count 0.40 0 - 0.80 K/UL    Absolute Eosinophil Count 0.10 0 - 0.45 K/UL    Absolute Basophil Count 0.00 0 - 0.20 K/UL   MAGNESIUM    Collection Time: 07/29/17  5:16 AM   Result Value Ref Range    Magnesium 2.0 1.6 - 2.6 mg/dL       Point of Care Testing  (Last 24 hours)  Glucose: (!) 133 (07/29/17 0516)    Radiology and other Diagnostics Review:    Pertinent radiology reviewed.      Sylvan Cheese, MD  Team Pager [phone]  Med-private Team L

## 2017-07-30 ENCOUNTER — Encounter: Admit: 2017-07-30 | Discharge: 2017-07-30 | Payer: MEDICARE

## 2017-07-30 LAB — MAGNESIUM: Lab: 1.9 mg/dL — ABNORMAL LOW (ref >60)

## 2017-07-30 LAB — COMPREHENSIVE METABOLIC PANEL: Lab: 140 MMOL/L — ABNORMAL LOW (ref 137–147)

## 2017-07-30 LAB — CBC AND DIFF: Lab: 4.6 K/UL — ABNORMAL HIGH (ref 4.5–11.0)

## 2017-07-30 LAB — O2HGB SAT-VENOUS POC
Lab: 70 % (ref 55–71)
Lab: 72 % — ABNORMAL HIGH (ref 55–71)

## 2017-07-30 MED ORDER — ASPIRIN 325 MG PO TBEC
325 mg | Freq: Once | ORAL | 0 refills | Status: CP
Start: 2017-07-30 — End: ?
  Administered 2017-07-30: 15:00:00 325 mg via ORAL

## 2017-07-30 MED ORDER — SODIUM CHLORIDE 0.9 % IV SOLP
250 mL | INTRAVENOUS | 0 refills | Status: DC | PRN
Start: 2017-07-30 — End: 2017-07-31

## 2017-07-30 NOTE — Progress Notes
General Progress Note    Name:  Deborah Fox   AVWUJ'W Date:  07/30/2017  Admission Date: 07/27/2017  LOS: 2 days                     Assessment/Plan:      66 year old Caucasian female with past history of CAD status post CABG, DES placement on 5/18 on aspirin/Plavix, hypertension, T2 DM, hyperlipidemia, GERD and sleep apnea presented to ED with complaints of having chest pain and abnormal sensation over her right side of the face.  ???  Atypical Chest Pain with Extensive Coronary Artery Disease  - Trop and EKG negative for acute ischemia  - Stress test with no inducible ischemia  - Cardiology recs appreciated  - Chest pain has resolved     Right Ventricular Dysfunction, Concern for Acute Cor Pulmonale  - Sleep study and PFTs normal at [facility] last year  - CTA chest negative for thromboembolic disease  - Repeat echo 8/13 with worsening RV function and dilation but normal PA pressure  - Episodes of hypotension and bradycardia, raising concern for RCA blockage.   - Discussed with Dr. Bufford Buttner, RHC and LHC performed today  - Severe native coronary disease, but all grafts and stents are patent.    - Lower extremity edema stable.      Right Facial Numbness, Confusion POA  - Resolved now but stroke activated on arrival due to facial droop. 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   - CT head with no acute changes  - Consider MRI brain given severity of vascular disease, likely means some degree of cerebrovascular disease also  - Obtain MRI brain tomorrow.   ???  CAD w CABG & DES on 5/18  - Continue DAPT  - Continue telemetry   ???  Hypertension  - Borderline low BP, hold BP lowering meds at this time.   ???  Type 2 diabetes mellitus  -Sliding-scale insulin  -Hold oral medications  ???  GERD  -Continue Protonix  ???  Full code    Disposition  - Continue inpatient admission for optimization of cardiac function and evaluation for CVA. Total visit time of 35 minutes with an estimated time of 20 minutes (face to face and unit/floor time) spent counseling the patient on abnormal labs and imaging results, treatment options  and coordinating care with patient, RN, NCM, SW.          ________________________________________________________________________    Subjective  Deborah Fox is a 66 y.o. female.  Patient seen in [facility], denies active complaints at this time. No chest pain. No SOB. No abd pain. No nausea. No vomiting. No headache. NO dizziness. No palpitations. No weakness. No fever. No cough. No sputum. LE edema is the same.        Medications  Scheduled Meds:    aspirin EC tablet 81 mg 81 mg Oral QDAY   clopiDOGrel (PLAVIX) tablet 75 mg 75 mg Oral QDAY   gabapentin (NEURONTIN) capsule 300 mg 300 mg Oral QHS   heparin (porcine) PF syringe 5,000 Units 5,000 Units Subcutaneous Q8H   levothyroxine (SYNTHROID) tablet 75 mcg 75 mcg Oral QDAY   pantoprazole DR (PROTONIX) tablet 40 mg 40 mg Oral QDAY(21)   pramipexole ER(+) (MIRAPEX ER) tablet 4.5 mg **patient own medication** 4.5 mg Oral BID   Continuous Infusions:  PRN and Respiratory Meds:acetaminophen Q4H PRN, melatonin QHS PRN, nitroglycerin Q5 MIN PRN, ondansetron (ZOFRAN) IV Q6H PRN  Objective                       Vital Signs: Last Filed                 Vital Signs: 24 Hour Range   BP: 137/58 (08/14 0810)  Temp: 36.5 ???C (97.7 ???F) (08/14 0810)  Pulse: 53 (08/14 0810)  Respirations: 16 PER MINUTE (08/14 0810)  SpO2: 98 % (08/14 0810)  O2 Delivery: None (Room Air) (08/14 0810)  Height: 157.5 cm (62) (08/13 0852) BP: (107-137)/(39-58)   Temp:  [36.2 ???C (97.2 ???F)-36.6 ???C (97.8 ???F)]   Pulse:  [51-64]   Respirations:  [16 PER MINUTE-17 PER MINUTE]   SpO2:  [93 %-98 %]   O2 Delivery: None (Room Air)   Intensity Pain Scale 0-10 (Pain 1): 2 (07/29/17 1524) Vitals:    07/29/17 0836 07/29/17 0852 07/30/17 0500   Weight: 92 kg (202 lb 13.2 oz) 93.4 kg (206 lb) 91.4 kg (201 lb 6.4 oz) Intake/Output Summary:  (Last 24 hours)    Intake/Output Summary (Last 24 hours) at 07/30/17 0842  Last data filed at 07/30/17 0810   Gross per 24 hour   Intake              500 ml   Output             1250 ml   Net             -750 ml      Stool Occurrence: 1 (per pt)    Physical Exam  General:  Alert, cooperative, no distress, appears stated age  Head:  Normocephalic, atraumatic.   Eyes:  Conjunctivae clear, pupils reactive  Oropharynx: Moist mucus membranes.   Neck: Mild JVD. Restricted ROM due to neck muscle pain  Lungs:  Clear to auscultation bilaterally  Heart:   Regular rate and rhythm, S1, S2 normal, no murmur.  Abdomen:  Soft, non-tender.  Bowel sounds normal.    Extremities: Moderate edema  Pulses:  2+ and symmetric, all extremities      Lab Review  24-hour labs:    Results for orders placed or performed during the hospital encounter of 07/27/17 (from the past 24 hour(s))   COMPREHENSIVE METABOLIC PANEL    Collection Time: 07/30/17  4:43 AM   Result Value Ref Range    Sodium 140 137 - 147 MMOL/L    Potassium 4.0 3.5 - 5.1 MMOL/L    Chloride 109 98 - 110 MMOL/L    Glucose 151 (H) 70 - 100 MG/DL    Blood Urea Nitrogen 17 7 - 25 MG/DL    Creatinine 1.61 0.4 - 1.00 MG/DL    Calcium 8.8 8.5 - 09.6 MG/DL    Total Protein 6.4 6.0 - 8.0 G/DL    Total Bilirubin 0.5 0.3 - 1.2 MG/DL    Albumin 3.8 3.5 - 5.0 G/DL    Alk Phosphatase 71 25 - 110 U/L    AST (SGOT) 20 7 - 40 U/L    CO2 26 21 - 30 MMOL/L    ALT (SGPT) 17 7 - 56 U/L    Anion Gap 5 3 - 12    eGFR Non African American >60 >60 mL/min    eGFR African American >60 >60 mL/min   CBC AND DIFF    Collection Time: 07/30/17  4:43 AM   Result Value Ref Range    White Blood Cells 4.6 4.5 - 11.0 K/UL  RBC 4.23 4.0 - 5.0 M/UL    Hemoglobin 14.0 12.0 - 15.0 GM/DL    Hematocrit 16.1 36 - 45 %    MCV 95.6 80 - 100 FL    MCH 33.1 26 - 34 PG    MCHC 34.6 32.0 - 36.0 G/DL    RDW 09.6 11 - 15 %    Platelet Count 125 (L) 150 - 400 K/UL    MPV 10.5 7 - 11 FL Neutrophils 68 41 - 77 %    Lymphocytes 23 (L) 24 - 44 %    Monocytes 7 4 - 12 %    Eosinophils 2 0 - 5 %    Basophils 0 0 - 2 %    Absolute Neutrophil Count 3.10 1.8 - 7.0 K/UL    Absolute Lymph Count 1.10 1.0 - 4.8 K/UL    Absolute Monocyte Count 0.30 0 - 0.80 K/UL    Absolute Eosinophil Count 0.10 0 - 0.45 K/UL    Absolute Basophil Count 0.00 0 - 0.20 K/UL   MAGNESIUM    Collection Time: 07/30/17  4:43 AM   Result Value Ref Range    Magnesium 1.9 1.6 - 2.6 mg/dL       Point of Care Testing  (Last 24 hours)  Glucose: (!) 151 (07/30/17 0443)    Radiology and other Diagnostics Review:    Pertinent radiology reviewed.      Sylvan Cheese, MD  Team Pager [phone]  Med-private Team L

## 2017-07-30 NOTE — Progress Notes
PHYSICAL THERAPY  NOTE     Patient denies changes from baseline mobility and reports no history of balance loss or falls in the last three months.  Patient endorses no concerns with mobility at home.  Currently, the patient is ambulating on the unit without difficulty.       Encouraged patient to continue mobilization while in the hospital.  Physical therapy services will be discontinued at this time, please re-consult if the patient has a change in mobility status.            Therapist: Collins Scotlandommy Van Towle  Date: 07/30/2017

## 2017-07-30 NOTE — Progress Notes
CV Consult Attending Progress Note   S: She overall seems to be doing fairly good.  Still short of breath with minimal exertion.  Denies any chest discomfort.  CT chest was negative for pulmonary embolism.  Echo Doppler and stress test were reviewed with her in detail last night and I reviewed the CT chest with her today.  Monitor shows sinus rhythm.  There were short runs of ventricular bigeminy and short runs of what appears to be narrow complex tachycardia.  In view of unexplained right ventricular dilatation and dysfunction in addition to cardiac arrhythmia and ongoing symptoms of shortness of breath I would recommend setting her up for a left and right heart cardiac catheterization.  Procedural cardiac catheterization was discussed at length with [facility].  Since he has had a complex coronary intervention and we are going to be doing a right and left heart cardiac catheterization I believe a coronary arteriogram would also be indicated especially since she continues to be symptomatic and has been having significant cardiac arrhythmias on the monitor.  I discussed the risks and benefits of the procedure.  She is agreeable to proceed.    Meds:     aspirin EC tablet 81 mg 81 mg Oral QDAY   clopiDOGrel (PLAVIX) tablet 75 mg 75 mg Oral QDAY   gabapentin (NEURONTIN) capsule 300 mg 300 mg Oral QHS   heparin (porcine) PF syringe 5,000 Units 5,000 Units Subcutaneous Q8H   levothyroxine (SYNTHROID) tablet 75 mcg 75 mcg Oral QDAY   pantoprazole DR (PROTONIX) tablet 40 mg 40 mg Oral QDAY(21)   pramipexole ER(+) (MIRAPEX ER) tablet 4.5 mg **patient own medication** 4.5 mg Oral BID      acetaminophen Q4H PRN, melatonin QHS PRN, nitroglycerin Q5 MIN PRN, ondansetron (ZOFRAN) IV Q6H PRN     O:   BP 137/58 (BP Source: Arm, Left Upper)  - Pulse 53  - Temp 36.5 ???C (97.7 ???F)  - Ht 1.575 m (5' 2)  - Wt 91.4 kg (201 lb 6.4 oz)  - SpO2 98%  - BMI 36.84 kg/m??? General Appearance: well developed, well nourished, appears stated age, no acute distress   Neck Veins: neck veins are not distended   Respiratory Effort: breathing comfortably, no respiratory distress   Auscultation/Percussion: lungs clear to auscultation, no rales or rhonchi, no wheezing   Cardiac Rhythm: regular rhythm and normal rate   Cardiac Auscultation: S1, S2 normal, no rub, no gallop   Murmurs: no murmur   Pedal Pulses: normal symmetric pedal pulses   Lower Extremity Edema: trace lower extremity edema   Abdominal Exam: soft, non-tender, non-distended, normal bowel sounds   Neurologic Exam: neurological assessment grossly intact     Tele: Sinus rhythm.  There is a ventricular bigeminy.  Labs:   24-hour labs:    Results for orders placed or performed during the hospital encounter of 07/27/17 (from the past 24 hour(s))   COMPREHENSIVE METABOLIC PANEL    Collection Time: 07/30/17  4:43 AM   Result Value Ref Range    Sodium 140 137 - 147 MMOL/L    Potassium 4.0 3.5 - 5.1 MMOL/L    Chloride 109 98 - 110 MMOL/L    Glucose 151 (H) 70 - 100 MG/DL    Blood Urea Nitrogen 17 7 - 25 MG/DL    Creatinine 4.54 0.4 - 1.00 MG/DL    Calcium 8.8 8.5 - 09.8 MG/DL    Total Protein 6.4 6.0 - 8.0 G/DL    Total Bilirubin 0.5 0.3 -  1.2 MG/DL    Albumin 3.8 3.5 - 5.0 G/DL    Alk Phosphatase 71 25 - 110 U/L    AST (SGOT) 20 7 - 40 U/L    CO2 26 21 - 30 MMOL/L    ALT (SGPT) 17 7 - 56 U/L    Anion Gap 5 3 - 12    eGFR Non African American >60 >60 mL/min    eGFR African American >60 >60 mL/min   CBC AND DIFF    Collection Time: 07/30/17  4:43 AM   Result Value Ref Range    White Blood Cells 4.6 4.5 - 11.0 K/UL    RBC 4.23 4.0 - 5.0 M/UL    Hemoglobin 14.0 12.0 - 15.0 GM/DL    Hematocrit 16.1 36 - 45 %    MCV 95.6 80 - 100 FL    MCH 33.1 26 - 34 PG    MCHC 34.6 32.0 - 36.0 G/DL    RDW 09.6 11 - 15 %    Platelet Count 125 (L) 150 - 400 K/UL    MPV 10.5 7 - 11 FL    Neutrophils 68 41 - 77 %    Lymphocytes 23 (L) 24 - 44 % Monocytes 7 4 - 12 %    Eosinophils 2 0 - 5 %    Basophils 0 0 - 2 %    Absolute Neutrophil Count 3.10 1.8 - 7.0 K/UL    Absolute Lymph Count 1.10 1.0 - 4.8 K/UL    Absolute Monocyte Count 0.30 0 - 0.80 K/UL    Absolute Eosinophil Count 0.10 0 - 0.45 K/UL    Absolute Basophil Count 0.00 0 - 0.20 K/UL   MAGNESIUM    Collection Time: 07/30/17  4:43 AM   Result Value Ref Range    Magnesium 1.9 1.6 - 2.6 mg/dL        A/P:   Principal Problem:    Chest pain  Active Problems:    CAD (coronary artery disease)    Sleep apnea    Hypothyroidism    HTN (hypertension)    Hyperlipidemia    GERD (gastroesophageal reflux disease)    Type 2 diabetes mellitus (HCC)    Weight gain, abnormal         Myriam Jacobson, MD

## 2017-07-30 NOTE — Progress Notes
OCCUPATIONAL THERAPY  NOTE     Chart reviewed and discussed patient status with physical therapy. Patient is up ad lib and physical therapy reported patient has no concerns for mobility or ADL/IADLs. Occupational therapy will be discontinued at this time. Please re-consult if patient status changes.    Therapist: Siri ColeLaura Antonyo Hinderer, OTS  Date: 07/30/2017

## 2017-07-31 ENCOUNTER — Inpatient Hospital Stay: Admit: 2017-07-29 | Discharge: 2017-07-29 | Payer: MEDICARE

## 2017-07-31 ENCOUNTER — Emergency Department: Admit: 2017-07-27 | Discharge: 2017-07-27 | Payer: MEDICARE

## 2017-07-31 ENCOUNTER — Encounter: Admit: 2017-07-31 | Discharge: 2017-07-31 | Payer: MEDICARE

## 2017-07-31 ENCOUNTER — Inpatient Hospital Stay: Admit: 2017-07-31 | Discharge: 2017-07-31 | Payer: MEDICARE

## 2017-07-31 DIAGNOSIS — E785 Hyperlipidemia, unspecified: ICD-10-CM

## 2017-07-31 DIAGNOSIS — Z951 Presence of aortocoronary bypass graft: ICD-10-CM

## 2017-07-31 DIAGNOSIS — K219 Gastro-esophageal reflux disease without esophagitis: ICD-10-CM

## 2017-07-31 DIAGNOSIS — G4733 Obstructive sleep apnea (adult) (pediatric): ICD-10-CM

## 2017-07-31 DIAGNOSIS — I25119 Atherosclerotic heart disease of native coronary artery with unspecified angina pectoris: Principal | ICD-10-CM

## 2017-07-31 DIAGNOSIS — E039 Hypothyroidism, unspecified: ICD-10-CM

## 2017-07-31 DIAGNOSIS — Z9884 Bariatric surgery status: ICD-10-CM

## 2017-07-31 DIAGNOSIS — I493 Ventricular premature depolarization: ICD-10-CM

## 2017-07-31 DIAGNOSIS — Z86711 Personal history of pulmonary embolism: ICD-10-CM

## 2017-07-31 DIAGNOSIS — G2581 Restless legs syndrome: ICD-10-CM

## 2017-07-31 DIAGNOSIS — I10 Essential (primary) hypertension: ICD-10-CM

## 2017-07-31 DIAGNOSIS — E119 Type 2 diabetes mellitus without complications: ICD-10-CM

## 2017-07-31 DIAGNOSIS — R079 Chest pain, unspecified: ICD-10-CM

## 2017-07-31 LAB — CBC AND DIFF: Lab: 3.7 K/UL — ABNORMAL LOW (ref 4.5–11.0)

## 2017-07-31 LAB — MAGNESIUM: Lab: 1.7 mg/dL — ABNORMAL LOW (ref 1.6–2.6)

## 2017-07-31 LAB — COMPREHENSIVE METABOLIC PANEL: Lab: 141 MMOL/L — ABNORMAL LOW (ref >60)

## 2017-07-31 MED ORDER — MIRTAZAPINE 7.5 MG PO TAB
7.5 mg | ORAL_TABLET | Freq: Every evening | ORAL | 6 refills | Status: AC
Start: 2017-07-31 — End: 2017-09-05
  Filled 2017-07-31: qty 30, 30d supply

## 2017-07-31 MED ORDER — MELATONIN 3 MG PO TAB
3 mg | ORAL_TABLET | Freq: Every evening | ORAL | 6 refills | 28.00000 days | Status: AC
Start: 2017-07-31 — End: 2017-09-05

## 2017-07-31 MED ORDER — FUROSEMIDE 40 MG PO TAB
40 mg | Freq: Every day | ORAL | 0 refills | Status: DC
Start: 2017-07-31 — End: 2017-08-01
  Administered 2017-07-31: 16:00:00 40 mg via ORAL

## 2017-07-31 MED ORDER — GADOBENATE DIMEGLUMINE 529 MG/ML (0.1MMOL/0.2ML) IV SOLN
19 mL | Freq: Once | INTRAVENOUS | 0 refills | Status: CP
Start: 2017-07-31 — End: ?
  Administered 2017-07-31: 21:00:00 19 mL via INTRAVENOUS

## 2017-07-31 MED ORDER — FUROSEMIDE 20 MG PO TAB
40 mg | ORAL_TABLET | Freq: Every morning | ORAL | 6 refills | 90.00000 days | Status: AC
Start: 2017-07-31 — End: 2017-09-05

## 2017-07-31 NOTE — Progress Notes
CV Consult Attending Progress Note   S: She denies any chest pain at this time.  Shortness of breath is stable.  Overall negative slightly over 3 L in this admission.  Cardiac catheterization films are reviewed with the patient.  Stents appear to be patent.  Vein graft and the radiograph along with a LIMA graft also noted to be patent.  Right heart pressures were normal.  Filling pressures on the right as well as on the left foot also noted to be normal.  Discussed with the patient and the family at length about the findings of the cardiac catheterization as well as the right and left heart pressures.  Also discussed with the admitting attending Dr Trevor Mace.  The most likely reason of mild right ventricular dilatation is probably underlying diastolic dysfunction.  The pressures were noted to be normal as she has diuresed a little over 3 L of fluid since admission.  My recommendation at this time would be to send her home on a small dose of diuretics.  Follow-up with her primary care doctor as well as the primary cardiologist Dr. Avie Arenas in the next few weeks.  A follow-up echo Doppler in about 3-4 months to see if there is any interval improvement or deterioration of right ventricular dilatation or function.  I spent approximately about 30 minutes with the patient and the family going over the questions and discussing the results of the right and left heart catheterization.    Meds:     aspirin EC tablet 81 mg 81 mg Oral QDAY   clopiDOGrel (PLAVIX) tablet 75 mg 75 mg Oral QDAY   gabapentin (NEURONTIN) capsule 300 mg 300 mg Oral QHS   heparin (porcine) PF syringe 5,000 Units 5,000 Units Subcutaneous Q8H   levothyroxine (SYNTHROID) tablet 75 mcg 75 mcg Oral QDAY   pantoprazole DR (PROTONIX) tablet 40 mg 40 mg Oral QDAY(21)   pramipexole ER(+) (MIRAPEX ER) tablet 4.5 mg **patient own medication** 4.5 mg Oral BID      acetaminophen Q4H PRN, melatonin QHS PRN, nitroglycerin Q5 MIN PRN, ondansetron (ZOFRAN) IV Q6H PRN     O: BP 134/43 (BP Source: Arm, Left Upper)  - Pulse 57  - Temp 36.5 ???C (97.7 ???F)  - Ht 1.575 m (5' 2)  - Wt 91.4 kg (201 lb 9.6 oz)  - SpO2 93%  - BMI 36.87 kg/m???    General Appearance: well developed, well nourished, appears stated age, no acute distress   Neck Veins: neck veins are not distended   Respiratory Effort: breathing comfortably, no respiratory distress   Auscultation/Percussion: lungs clear to auscultation, no rales or rhonchi, no wheezing   Cardiac Rhythm: regular rhythm and normal rate   Cardiac Auscultation: S1, S2 normal, no rub, no gallop   Murmurs: no murmur   Pedal Pulses: normal symmetric pedal pulses   Lower Extremity Edema: no lower extremity edema   Abdominal Exam: soft, non-tender, non-distended, normal bowel sounds   Neurologic Exam: neurological assessment grossly intact     Tele: Sinus rhythm. Rare PVC.  Labs:   24-hour labs:    Results for orders placed or performed during the hospital encounter of 07/27/17 (from the past 24 hour(s))   O2HGB SAT-VENOUS POC    Collection Time: 07/30/17  1:27 PM   Result Value Ref Range    O2HGB SAT-Venous POC 70.5 55 - 71 %   O2HGB SAT-VENOUS POC    Collection Time: 07/30/17  1:31 PM   Result Value Ref Range  O2HGB SAT-Venous POC 72.4 (H) 55 - 71 %   COMPREHENSIVE METABOLIC PANEL    Collection Time: 07/31/17  3:00 AM   Result Value Ref Range    Sodium 141 137 - 147 MMOL/L    Potassium 3.6 3.5 - 5.1 MMOL/L    Chloride 112 (H) 98 - 110 MMOL/L    Glucose 137 (H) 70 - 100 MG/DL    Blood Urea Nitrogen 17 7 - 25 MG/DL    Creatinine 1.61 0.4 - 1.00 MG/DL    Calcium 8.2 (L) 8.5 - 10.6 MG/DL    Total Protein 5.3 (L) 6.0 - 8.0 G/DL    Total Bilirubin 0.4 0.3 - 1.2 MG/DL    Albumin 3.2 (L) 3.5 - 5.0 G/DL    Alk Phosphatase 60 25 - 110 U/L    AST (SGOT) 17 7 - 40 U/L    CO2 25 21 - 30 MMOL/L    ALT (SGPT) 14 7 - 56 U/L    Anion Gap 4 3 - 12    eGFR Non African American >60 >60 mL/min    eGFR African American >60 >60 mL/min   CBC AND DIFF Collection Time: 07/31/17  3:00 AM   Result Value Ref Range    White Blood Cells 3.7 (L) 4.5 - 11.0 K/UL    RBC 3.73 (L) 4.0 - 5.0 M/UL    Hemoglobin 12.1 12.0 - 15.0 GM/DL    Hematocrit 09.6 (L) 36 - 45 %    MCV 96.3 80 - 100 FL    MCH 32.4 26 - 34 PG    MCHC 33.6 32.0 - 36.0 G/DL    RDW 04.5 (H) 11 - 15 %    Platelet Count 101 (L) 150 - 400 K/UL    MPV 10.3 7 - 11 FL    Neutrophils 63 41 - 77 %    Lymphocytes 27 24 - 44 %    Monocytes 8 4 - 12 %    Eosinophils 2 0 - 5 %    Basophils 0 0 - 2 %    Absolute Neutrophil Count 2.30 1.8 - 7.0 K/UL    Absolute Lymph Count 1.00 1.0 - 4.8 K/UL    Absolute Monocyte Count 0.30 0 - 0.80 K/UL    Absolute Eosinophil Count 0.10 0 - 0.45 K/UL    Absolute Basophil Count 0.00 0 - 0.20 K/UL   MAGNESIUM    Collection Time: 07/31/17  3:00 AM   Result Value Ref Range    Magnesium 1.7 1.6 - 2.6 mg/dL        A/P:   Principal Problem:    Chest pain  Active Problems:    CAD (coronary artery disease)    Sleep apnea    Hypothyroidism    HTN (hypertension)    Hyperlipidemia    GERD (gastroesophageal reflux disease)    Type 2 diabetes mellitus (HCC)    Precordial pain    Weight gain, abnormal         Myriam Jacobson, MD

## 2017-07-31 NOTE — Progress Notes
General Progress Note    Name:  Deborah Fox   ZOXWR'U Date:  07/31/2017  Admission Date: 07/27/2017  LOS: 3 days                     Assessment/Plan:      66 year old Caucasian female with past history of CAD status post CABG, DES placement on 5/18 on aspirin/Plavix, hypertension, T2 DM, hyperlipidemia, GERD and sleep apnea presented to ED with complaints of having chest pain and abnormal sensation over her right side of the face.  ???  Atypical Chest Pain with Extensive Coronary Artery Disease  - Trop and EKG negative for acute ischemia  - Stress test with no inducible ischemia. LHC with no blockage, patent stents and grafts.   - Cardiology recs appreciated  - Chest pain has resolved     Right Ventricular Dysfunction, Concern for Acute Cor Pulmonale  - Sleep study and PFTs normal at [facility] last year  - CTA chest negative for thromboembolic disease  - Repeat echo 8/13 with worsening RV function and dilation but normal PA pressure  - RHC and LHC performed 8/14, with no evidence of RV dysfunction. Coronary grafts and stented RCA with good flow.  - Likely resolution of RV dysfunction with diuresis.   - Discharge on 40 mg oral lasix daily.       Right Facial Numbness, Confusion POA  - Resolved now but stroke activated on arrival due to facial droop. 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   - CT head with no acute changes  - Brain MRI with and without contrast with microvascular disease, no acute pathology  ???  CAD w CABG & DES on 5/18  - Continue DAPT  - Continue telemetry .   ???  Type 2 diabetes mellitus  -Sliding-scale insulin  - Resume oral medications  ???  GERD  -Continue Protonix    Narcolepsy  - Sleep lab study normal within the past year.   - Likely due to poor quality sleep  - Melatonin prescribed, as well as written script for Remeron in case needed.   ???  Full code    Disposition  - Discharge home today . 35 minutes were spent coordinating discharge with cardiology and preparing orders as well as counseling of discharge.         ________________________________________________________________________    Subjective  Deborah Fox is a 66 y.o. female. Is satisfied with plan of care, wants to go home today. No chest pain. No SOB. No abd pain. No nausea. No vomiting. No headache. NO dizziness. No palpitations. No weakness. No fever. No cough. No sputum. LE edema is the same.        Medications  Scheduled Meds:    aspirin EC tablet 81 mg 81 mg Oral QDAY   clopiDOGrel (PLAVIX) tablet 75 mg 75 mg Oral QDAY   gabapentin (NEURONTIN) capsule 300 mg 300 mg Oral QHS   heparin (porcine) PF syringe 5,000 Units 5,000 Units Subcutaneous Q8H   levothyroxine (SYNTHROID) tablet 75 mcg 75 mcg Oral QDAY   pantoprazole DR (PROTONIX) tablet 40 mg 40 mg Oral QDAY(21)   pramipexole ER(+) (MIRAPEX ER) tablet 4.5 mg **patient own medication** 4.5 mg Oral BID   Continuous Infusions:  PRN and Respiratory Meds:acetaminophen Q4H PRN, melatonin QHS PRN, nitroglycerin Q5 MIN PRN, ondansetron (ZOFRAN) IV Q6H PRN, sodium chloride 0.9% (NS) PRN      Objective  Vital Signs: Last Filed                 Vital Signs: 24 Hour Range   BP: 134/43 (08/15 0815)  Temp: 36.5 ???C (97.7 ???F) (08/15 0815)  Pulse: 57 (08/15 0819)  Respirations: 18 PER MINUTE (08/15 0815)  SpO2: 93 % (08/15 0815)  O2 Delivery: None (Room Air) (08/15 0815)  SpO2 Pulse: 49 (08/15 0300) BP: (113-159)/(41-73)   Temp:  [36.3 ???C (97.3 ???F)-36.7 ???C (98.1 ???F)]   Pulse:  [47-75]   Respirations:  [6 PER MINUTE-26 PER MINUTE]   SpO2:  [89 %-98 %]   O2 Delivery: None (Room Air)     Vitals:    07/29/17 0852 07/30/17 0500 07/31/17 0618   Weight: 93.4 kg (206 lb) 91.4 kg (201 lb 6.4 oz) 91.4 kg (201 lb 9.6 oz)       Intake/Output Summary:  (Last 24 hours)    Intake/Output Summary (Last 24 hours) at 07/31/17 0950  Last data filed at 07/31/17 0900   Gross per 24 hour Intake              575 ml   Output              865 ml   Net             -290 ml      Stool Occurrence: 1 (per pt)    Physical Exam  General:  Alert, cooperative, no distress, appears stated age  Head:  Normocephalic, atraumatic.   Eyes:  Conjunctivae clear, pupils reactive  Oropharynx: Moist mucus membranes.   Neck: Mild JVD. Restricted ROM due to neck muscle pain  Lungs:  Clear to auscultation bilaterally  Heart:   Regular rate and rhythm, S1, S2 normal, no murmur.  Abdomen:  Soft, non-tender.  Bowel sounds normal.    Extremities: Moderate edema  Pulses:  2+ and symmetric, all extremities      Lab Review  24-hour labs:    Results for orders placed or performed during the hospital encounter of 07/27/17 (from the past 24 hour(s))   O2HGB SAT-VENOUS POC    Collection Time: 07/30/17  1:27 PM   Result Value Ref Range    O2HGB SAT-Venous POC 70.5 55 - 71 %   O2HGB SAT-VENOUS POC    Collection Time: 07/30/17  1:31 PM   Result Value Ref Range    O2HGB SAT-Venous POC 72.4 (H) 55 - 71 %   COMPREHENSIVE METABOLIC PANEL    Collection Time: 07/31/17  3:00 AM   Result Value Ref Range    Sodium 141 137 - 147 MMOL/L    Potassium 3.6 3.5 - 5.1 MMOL/L    Chloride 112 (H) 98 - 110 MMOL/L    Glucose 137 (H) 70 - 100 MG/DL    Blood Urea Nitrogen 17 7 - 25 MG/DL    Creatinine 4.54 0.4 - 1.00 MG/DL    Calcium 8.2 (L) 8.5 - 10.6 MG/DL    Total Protein 5.3 (L) 6.0 - 8.0 G/DL    Total Bilirubin 0.4 0.3 - 1.2 MG/DL    Albumin 3.2 (L) 3.5 - 5.0 G/DL    Alk Phosphatase 60 25 - 110 U/L    AST (SGOT) 17 7 - 40 U/L    CO2 25 21 - 30 MMOL/L    ALT (SGPT) 14 7 - 56 U/L    Anion Gap 4 3 - 12    eGFR Non African American >60 >60 mL/min  eGFR African American >60 >60 mL/min   CBC AND DIFF    Collection Time: 07/31/17  3:00 AM   Result Value Ref Range    White Blood Cells 3.7 (L) 4.5 - 11.0 K/UL    RBC 3.73 (L) 4.0 - 5.0 M/UL    Hemoglobin 12.1 12.0 - 15.0 GM/DL    Hematocrit 16.1 (L) 36 - 45 %    MCV 96.3 80 - 100 FL    MCH 32.4 26 - 34 PG MCHC 33.6 32.0 - 36.0 G/DL    RDW 09.6 (H) 11 - 15 %    Platelet Count 101 (L) 150 - 400 K/UL    MPV 10.3 7 - 11 FL    Neutrophils 63 41 - 77 %    Lymphocytes 27 24 - 44 %    Monocytes 8 4 - 12 %    Eosinophils 2 0 - 5 %    Basophils 0 0 - 2 %    Absolute Neutrophil Count 2.30 1.8 - 7.0 K/UL    Absolute Lymph Count 1.00 1.0 - 4.8 K/UL    Absolute Monocyte Count 0.30 0 - 0.80 K/UL    Absolute Eosinophil Count 0.10 0 - 0.45 K/UL    Absolute Basophil Count 0.00 0 - 0.20 K/UL   MAGNESIUM    Collection Time: 07/31/17  3:00 AM   Result Value Ref Range    Magnesium 1.7 1.6 - 2.6 mg/dL       Point of Care Testing  (Last 24 hours)  Glucose: (!) 137 (07/31/17 0300)    Radiology and other Diagnostics Review:    Pertinent radiology reviewed.      Sylvan Cheese, MD  Team Pager [phone]  Med-private Team L

## 2017-07-31 NOTE — Progress Notes
Assumed care at 1900.    No acute events overnight.  Assessments completed and documented per flowsheet.    A&Ox4,  VSS per pt trend,  tolerating RA, denies SOA,  SR to SB (40's/50's) on tele overnight.  No pain.  Puncture sites CDI.  Absent of hematomas.  BM 07/29/17,  Adequate UOP overnight.  Pt up ad lib.    No other needs voiced at this time.  Call light within reach.  Will continue to monitor.

## 2017-07-31 NOTE — Case Management (ED)
Case Management Progress Note    NAME:Deborah Fox                          MRN: [id number]              DOB:[birth year]          AGE: 66 y.o.  ADMISSION DATE: 07/27/2017             DAYS ADMITTED: LOS: 3 days      Todays Date: 07/31/2017    Plan   Per [facility] huddle, team anticipates patient to DC home today   Patient's husband, Deborah Fox, will provide transportation home   NCM will provide letter for Deborah Fox' employer     Interventions  ? Support   Support: Pt/Family Updates re:POC or DC Plan  ? Info or Referral   Information or Referral to Community Resources: No Needs Identified  ? Discharge Planning   Discharge Planning: Patient/Family Letter  ? Medication Needs   Medication Needs: No Needs Identified  ? Financial   Financial: No Needs Identified  ? Legal   Legal: No Needs Identified  ? Other   Other/None: No needs identified    Disposition  ? Expected Discharge Date    Expected Discharge Date: 07/31/17  ? Transportation   Does the patient need discharge transport arranged?: No  Transportation Name, Phone and Availability #1: Deborah Fox, [phone]  Does the patient use Medicaid Transportation?: No    Shanda BumpsJessica Female BSN, RN Case Manager  [facility]  Phone: [phone] * Pager: [phone]

## 2017-07-31 NOTE — Progress Notes
Assessments complete and documented per flowsheet, no acute changes noted.   Pt to MRI today.  A/Ox4. VSS. Tolerating RA. SR/SB/SA on tele.   Denied pain.   Cath access sites clean, dry, and soft to palpation.   Ambulating multiple laps independently and without difficulty.   UOA, last BM 8/13.     Discharge instructions reviewed with pt. Pt asked appropriate questions, verbalized understanding of all instructions, and denied further questions/concerns. IV and tele removed. Furosemide sent to [place]'s pharmacy in [place]. Pt educated by provider that melatonin could be purchased over the counter and remeron script (provided on paper by Dr. Trevor Maceaza) was to be used if melatonin did not work. Pt verbalized understanding. Pt left unit with discharge instructions and all personal belongings.

## 2017-08-01 ENCOUNTER — Encounter: Admit: 2017-08-01 | Discharge: 2017-08-01 | Payer: MEDICARE

## 2017-08-01 NOTE — Discharge Instructions - Pharmacy
Physician Discharge Summary      Name: Deborah Fox  Medical Record Number: [id number]        Account Number:  [id number]  Date Of Birth:  1951/05/18                         Age:  66 years   Admit date:  07/27/2017                     Discharge date: 07/31/2017    Attending Physician:  Sylvan Cheese              Service: Med Private L- [phone]    Physician Summary completed by: Sylvan Cheese, MD    Admission Diagnosis: Chest pain      Significant PMH:   Past Medical History:   Diagnosis Date   ??? CAD (coronary artery disease)    ??? Diabetes (HCC)    ??? HTN (hypertension)    ??? Hyperlipemia        Allergies: Cephalexin; Hydrocodone-acetaminophen; Oxycodone-acetaminophen; Statins-hmg-coa reductase inhibitors; Doxycycline; Erythromycin; Flagyl [metronidazole]; Penicillins; and Sulfa (sulfonamide antibiotics)    Diagnostic Procedures:  Noted in hospital course.    Surgical Procedures:  None.     Brief Hospital Course:   66 year old Caucasian female with past???history of CAD status post CABG, DES placement on 5/18 on aspirin/Plavix, hypertension, T2 DM, hyperlipidemia, GERD and sleep apnea presented to ED with complaints of having chest pain and abnormal sensation over her right side of the face.  Atypical Chest Pain with Extensive Coronary Artery Disease- Trop and EKG negative for acute ischemia. Stress test with no inducible ischemia. LHC with no blockage, patent stents and grafts. Cardiology recs appreciated. Chest pain has resolved   Right Ventricular Dysfunction, Concern for Acute Cor Pulmonale- Sleep study and PFTs normal at [facility] last year. CTA chest negative for thromboembolic disease. Repeat echo 8/13 with worsening RV function and dilation but normal PA pressure. RHC and LHC performed 8/14, with no evidence of RV dysfunction. Coronary grafts and stented RCA with good flow. Likely resolution of RV dysfunction with diuresis. Discharge on 40 mg oral lasix daily. Right Facial Numbness, Confusion POA- Resolved now but stroke activated on arrival due to facial droop. 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 CT head with no acute changes. Brain MRI with and without contrast with microvascular disease, no acute pathology  CAD w CABG & DES on 5/18- Continue DAPT. Continue telemetry .   Type 2 diabetes mellitus - Resume oral medications  GERD-Continue Protonix  Narcolepsy- Sleep lab study normal within the past year. Likely due to poor quality sleep. Melatonin prescribed, as well as written script for Remeron in case needed. Sleep hygiene discussed.     Condition at Discharge: Stable    Discharge Diagnoses:   Principal Problem:    Chest pain  Active Problems:    CAD (coronary artery disease)    Hypothyroidism    HTN (hypertension)    Hyperlipidemia    GERD (gastroesophageal reflux disease)    Type 2 diabetes mellitus (HCC)    Precordial pain    Weight gain, abnormal      Consults:  Cardiology    Patient Disposition: Home       Patient instructions/medications:     Activity as Tolerated   It is important to keep increasing your activity level after you leave the hospital.  Moving around can help prevent blood clots,  lung infection (pneumonia) and other problems.  Gradually increasing the number of times you are up moving around will help you return to your normal activity level more quickly.  Continue to increase the number of times you are up to the chair and walking daily to return to your normal activity level. Begin to work toward your normal activity level at discharge     Report These Signs and Symptoms   Please contact your doctor if you have any of the following symptoms: temperature higher than 100 degrees F, uncontrolled pain, persistent nausea and/or vomiting or difficulty breathing     Questions About Your Stay   For questions or concerns regarding your hospital stay. Call [phone]   Discharging attending physician: Sylvan Cheese [[id number]] Diabetic Diet   You should eat between 1600 and 2000 calories per day.  This is equal to 60g (grams) of carbohydrates per meal, and 30g of carbohydrates for a bedtime snack.    If you have questions about your diet after you go home, you can call a dietitian at [phone].     Additional Discharge Instructions   Please fill Mirtazapine if Melatonin does not work for improving your sleep.        Current Discharge Medication List       START taking these medications    Details   melatonin 3 mg tab Take one tablet by mouth at bedtime daily.  Qty: 30 tablet, Refills: 6    PRESCRIPTION TYPE:  Normal      mirtazapine (REMERON) 7.5 mg tablet Take one tablet by mouth at bedtime daily.  Qty: 30 tablet, Refills: 6    PRESCRIPTION TYPE:  Print          CONTINUE these medications which have been CHANGED or REFILLED    Details   furosemide (LASIX) 20 mg tablet Take two tablets by mouth every morning.  Qty: 30 tablet, Refills: 6    PRESCRIPTION TYPE:  Normal          CONTINUE these medications which have NOT CHANGED    Details   aspirin EC 81 mg tablet Take 1 tablet by mouth daily. Take with food.  Qty: 90 tablet, Refills: 3    PRESCRIPTION TYPE:  OTC  Associated Diagnoses: Coronary artery disease of native artery of native heart with stable angina pectoris (HCC); Essential hypertension; Pure hypercholesterolemia; Type 2 diabetes mellitus with diabetic neuropathy, without long-term current use of insulin (HCC)      clindamycin(+) (CLEOCIN) 300 mg capsule Take 600 mg by mouth. Take before dentist appointments.    PRESCRIPTION TYPE:  Historical Med      clopiDOGrel (PLAVIX) 75 mg tablet Take 1 tablet by mouth daily.  Qty: 90 tablet, Refills: 3    PRESCRIPTION TYPE:  Normal  Comments: Please dispense 4 additional tabs for 300mg  loading dose Thanks      evolocumab (REPATHA) 140 mg/mL injectable PEN Inject 1 mL under the skin every 14 days.  Qty: 2 mL, Refills: 11    PRESCRIPTION TYPE:  Normal Comments: May dispense 90 days with 3 refills      gabapentin (NEURONTIN) 300 mg capsule Take 300 mg by mouth at bedtime daily.    PRESCRIPTION TYPE:  Historical Med      glimepiride (AMARYL) 4 mg tablet Take 8 mg by mouth daily with breakfast.    PRESCRIPTION TYPE:  Historical Med      levothyroxine (SYNTHROID) 75 mcg tablet Take 75 mcg by mouth daily.  PRESCRIPTION TYPE:  Historical Med  Associated Diagnoses: CAD (coronary artery disease); HTN (hypertension); Hyperlipidemia      magnesium oxide (MAG-OX) 400 mg tablet Take 400 mg by mouth daily.    PRESCRIPTION TYPE:  Historical Med      nitroglycerin (NITROSTAT) 0.4 mg tablet Take one tablet sublingually three times five minutes apart for chest pain.  Qty: 25 tablet, Refills: 3    PRESCRIPTION TYPE:  Normal      pantoprazole DR (PROTONIX) 40 mg tablet Take 40 mg by mouth twice daily.    PRESCRIPTION TYPE:  Historical Med      pioglitazone (ACTOS) 30 mg tablet Take 30 mg by mouth daily.    PRESCRIPTION TYPE:  Historical Med      pramipexole ER(+) (MIRAPEX ER) 4.5 mg tablet Take 1 tablet by mouth twice daily.    PRESCRIPTION TYPE:  Historical Med          The following medications were removed from your list. This list includes medications discontinued this stay and those removed from your prior med list in our system        dexlansoprazole (+) (DEXILANT) 60 mg capsule               Diet: Diabetic    Activity: As tolerated    Follow Up Appointment: Please refer to discharge instructions regarding follow up appointments.      Signed:  Sylvan Cheese, MD  07/31/2017      cc:  Primary Care Physician:  Fredia Sorrow A   Referring physicians:

## 2017-08-02 LAB — POC ACTIVATED CLOTTING TIME: Lab: 245 s

## 2017-08-07 LAB — BASIC METABOLIC PANEL
Lab: 0.8
Lab: 107
Lab: 11
Lab: 139
Lab: 20
Lab: 25
Lab: 273 — ABNORMAL HIGH (ref 80–115)
Lab: 75
Lab: 8.8
Lab: 87

## 2017-08-07 LAB — MAGNESIUM: Lab: 2

## 2017-08-10 ENCOUNTER — Encounter: Admit: 2017-08-10 | Discharge: 2017-08-10 | Payer: MEDICARE

## 2017-09-05 ENCOUNTER — Ambulatory Visit: Admit: 2017-09-05 | Discharge: 2017-09-06 | Payer: BC Managed Care – PPO

## 2017-09-05 ENCOUNTER — Encounter: Admit: 2017-09-05 | Discharge: 2017-09-05 | Payer: MEDICARE

## 2017-09-05 DIAGNOSIS — I499 Cardiac arrhythmia, unspecified: ICD-10-CM

## 2017-09-05 DIAGNOSIS — I2510 Atherosclerotic heart disease of native coronary artery without angina pectoris: ICD-10-CM

## 2017-09-05 DIAGNOSIS — E785 Hyperlipidemia, unspecified: ICD-10-CM

## 2017-09-05 DIAGNOSIS — I10 Essential (primary) hypertension: ICD-10-CM

## 2017-09-05 DIAGNOSIS — E1140 Type 2 diabetes mellitus with diabetic neuropathy, unspecified: ICD-10-CM

## 2017-09-05 DIAGNOSIS — Z951 Presence of aortocoronary bypass graft: ICD-10-CM

## 2017-09-05 DIAGNOSIS — E782 Mixed hyperlipidemia: Principal | ICD-10-CM

## 2017-09-05 DIAGNOSIS — R600 Localized edema: ICD-10-CM

## 2017-09-05 DIAGNOSIS — E119 Type 2 diabetes mellitus without complications: ICD-10-CM

## 2017-09-05 DIAGNOSIS — I493 Ventricular premature depolarization: ICD-10-CM

## 2017-09-05 DIAGNOSIS — R9431 Abnormal electrocardiogram [ECG] [EKG]: ICD-10-CM

## 2017-09-05 DIAGNOSIS — Z789 Other specified health status: ICD-10-CM

## 2017-09-05 MED ORDER — TORSEMIDE 20 MG PO TAB
ORAL_TABLET | Freq: Every day | ORAL | 5 refills | 67.50000 days | Status: AC
Start: 2017-09-05 — End: 2017-09-05

## 2017-09-05 MED ORDER — TORSEMIDE 20 MG PO TAB
ORAL_TABLET | ORAL | 5 refills | 67.50000 days | Status: AC
Start: 2017-09-05 — End: 2017-11-06

## 2017-09-05 NOTE — Progress Notes
Date of Service: 09/05/2017    Deborah RECKERS is a 66 y.o. female.       HPI     Patient is a 66 year old white female with a history of premature coronary artery disease.  She was last seen by me in the office on June 07, 2017.  Prior to that office visit patient was admitted at [facility] and she underwent PCI of the RCA on 05/01/2017.    She had recurrent chest pain and decided to present back to the hospital.  She was admitted between August 11 and July 31, 2017.  Patient presented with chest pain.    During this admission she did undergo a left heart catheterization which revealed severe native coronary artery disease, patent LIMA to LAD, patent SVG to D1, OM1, OM 2 and a jump graft to PDA (this appeared to be slightly smaller and atretic), patent previously placed stent in the proximal to distal RCA, the left ventricular end-diastolic pressure was normal.  Patient was also evaluated with a right heart catheterization and the pulmonary artery and primary capillary wedge pressure was normal, normal cardiac output and cardiac indices.    A 2D echo Doppler study that was performed prior to this heart catheterization revealed moderately reduced right ventricular systolic function with moderately dilated right ventricle, no pericardial effusion was present.    This patient is here today and she reports not doing well altogether.  She has not had chest pain.  She does report symptoms of fatigue.  She also has bilateral lower extremity edema and does have a wound on the right pretibial surface, she applied a bandage around it.  The blood pressure was slightly elevated, the heart rate is 68 bpm.         Vitals:    09/05/17 1536 09/05/17 1545   BP: 158/74 152/74   Pulse: 68    Weight: 92.9 kg (204 lb 12.8 oz)    Height: 1.524 m (5')      Body mass index is 40 kg/m???.     Past Medical History  Patient Active Problem List    Diagnosis Date Noted   ??? Statin intolerance 06/07/2017 ??? Bilateral leg edema 06/07/2017   ??? 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 Abnormal thallium stress test 04/03/2017   ??? Hospital discharge follow-up 04/03/2017   ??? 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 Initial neck injections of Botox caused neck extensor weakness.     ??? Type 2 diabetes mellitus (HCC) 04/06/2015   ??? Precordial pain 04/06/2015   ??? 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 Weight gain, abnormal 04/06/2015   ??? Pre-operative cardiovascular examination 04/08/2014   ??? Abdominal pain 06/24/2013   ??? Claudication (HCC) 01/08/2012   ??? Chest pain 12/28/2010   ??? 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 CAD (coronary artery disease) 10/06/2009     04/10/17 Cardiac Catheterization by Dr. Chales Abrahams:  1. Significant coronary artery disease manifesting with complete occlusion of the proximal circumflex artery and severe disease of the proximal mid and distal right coronary artery and moderate disease of the left anterior descending.  2. Patent saphenous vein graft to diagonal 1, obtuse marginal 1, obtuse marginal 2, and a jump radial graft to the posterior descending artery.  3. Patent left internal mammary artery to left anterior descending.  4. Severe disease of the right coronary artery that appears to supply  a large posterolateral ventricular branch but not the posterior descending artery.  5. Normal left ventricular end-diastolic pressure.  ???     ??? RLS (restless legs syndrome) 10/06/2009   ??? Hypothyroidism 10/06/2009   ??? HTN (hypertension) 10/06/2009   ??? Hyperlipidemia 10/06/2009   ??? GERD (gastroesophageal reflux disease) 10/06/2009   ??? Lichen planus 10/06/2009         Review of Systems   Constitution: Negative.   HENT: Negative.    Eyes: Negative.    Cardiovascular: Positive for leg swelling.   Respiratory: Negative.    Endocrine: Negative.    Hematologic/Lymphatic: Negative.    Skin: Negative.    Musculoskeletal: Negative.    Gastrointestinal: Negative. Genitourinary: Negative.    Neurological: Negative.    Psychiatric/Behavioral: Negative.    Allergic/Immunologic: Negative.        Physical Exam  General Appearance: obese  Skin: warm, moist, no ulcers or xanthomas  Eyes: conjunctivae and lids normal, pupils are equal and round  Lips & Oral Mucosa: no pallor or cyanosis  Neck Veins: neck veins are flat, neck veins are not distended  Chest Inspection: chest is normal in appearance  Respiratory Effort: breathing comfortably, no respiratory distress  Auscultation/Percussion: lungs clear to auscultation, no rales or rhonchi, no wheezing  Cardiac Rhythm: irregular rhythm and normal rate  Cardiac Auscultation: S1, S2 normal, no rub, no gallop  Murmurs: no murmur  Carotid Arteries: normal carotid upstroke bilaterally, no bruit  Abdominal aorta: could not be examined due to obese adomen  Lower Extremity Edema: 2+  lower extremities edema  Abdominal Exam: soft, non-tender, no masses, bowel sounds normal  Liver & Spleen: no organomegaly  Language and Memory: patient responsive and seems to comprehend information  Neurologic Exam: neurological assessment grossly intact      Cardiovascular Studies  Twelve-lead EKG demonstrates normal sinus rhythm, numerous PVCs occurred in a bigeminy fashion, they were monomorphic.    Problems Addressed Today  Encounter Diagnoses   Name Primary?   ??? Mixed hyperlipidemia Yes   ??? Coronary artery disease involving native coronary artery of native heart without angina pectoris    ??? Bigeminy    ??? Frequent PVCs    ??? Essential hypertension    ??? Hx of CABG    ??? Type 2 diabetes mellitus with diabetic neuropathy, without long-term current use of insulin (HCC)    ??? Bilateral leg edema    ??? Statin intolerance    ??? PVC's (premature ventricular contractions)    ??? Abnormal EKG        Assessment and Plan     1.  Coronary artery disease  ??? Status post PCI to RCA on 05/01/2017, patient has not experienced any symptoms compatible with angina ??? Status post CABG, patient underwent a left heart catheterization on April 10, 2017, she is known to have significant coronary artery disease that consist of complete occlusion of the proximal left circumflex artery, severe disease of the proximal mid and distal RCA, moderate disease of the LAD, patent SVG to D1, OM1 and OM 2, jump radial graft to the PDA, patent LIMA to LAD, severe disease of the RCA??? the right coronary system was stented in this last left heart catheterization  ??? Patient did developed intolerance to ticagrelor and she was switched to clopidogrel  ??? Recently admission at [facility] between August 11 and July 31, 2017, patient presented with chest pain  ??? Due to her known disease and presentation she was evaluated with another  left heart catheterization, she was found to have a patent LIMA to LAD, patent SVG to the to OM1 and OM 2 and a jump graft to PDA (this appeared to be smaller and the 20) the previously placed stents in the RCA were patent  ??? Due to abnormal findings of right ventricular dilatation and hypokinesis on echocardiogram dated 07/29/2017 patient also underwent a right heart cardiac catheterization at the same time    2.  Bilateral lower extremity edema, it did not reveal any abnormalities  ??? This is probably due to diastolic dysfunction and may be also noncompliance with a low-salt diet    3.  Abnormal electrocardiogram  ??? Patient was found to have frequent monomorphic PVCs and bigeminy on the twelve-lead surface EKG    4.  Hyperlipidemia  ??? Patient is intolerant to statin therapy as well as Zetia  ??? Patient was started on Repatha, recent cholesterol profile has not been performed    5.  Diabetes mellitus type 2  ??? Patient states that the most recent hemoglobin A1c is approximately 7.1%    6.  History of morbid obesity  ??? Patient underwent previous gastric bypass    Plan:    1.  I did ask the patient to alternate furosemide 20 mg with 40 mg every other day 2.  In the light of the bigeminy detected today on the twelve-lead EKG she will be further evaluated with a 14 days Holter monitor  3.  Will also obtain a chemistry profile along with a fasting lipid and liver profile  4.  Follow-up office visit in 1 month         Current Medications (including today's revisions)  ??? aspirin EC 81 mg tablet Take 1 tablet by mouth daily. Take with food.   ??? clindamycin(+) (CLEOCIN) 300 mg capsule Take 600 mg by mouth. Take before dentist appointments.   ??? clopiDOGrel (PLAVIX) 75 mg tablet Take 1 tablet by mouth daily.   ??? cyanocobalamin (VITAMIN B-12, RUBRAMIN) 1,000 mcg/mL injection Inject 1 mL into the muscle every 30 days.   ??? evolocumab (REPATHA) 140 mg/mL injectable PEN Inject 1 mL under the skin every 14 days.   ??? gabapentin (NEURONTIN) 300 mg capsule Take 300 mg by mouth at bedtime daily.   ??? glimepiride (AMARYL) 4 mg tablet Take 8 mg by mouth daily with breakfast.   ??? levothyroxine (SYNTHROID) 75 mcg tablet Take 75 mcg by mouth daily.   ??? magnesium oxide (MAG-OX) 400 mg tablet Take 400 mg by mouth daily.   ??? nitroglycerin (NITROSTAT) 0.4 mg tablet Take one tablet sublingually three times five minutes apart for chest pain.   ??? pantoprazole DR (PROTONIX) 40 mg tablet Take 40 mg by mouth twice daily.   ??? pioglitazone (ACTOS) 30 mg tablet Take 30 mg by mouth daily.   ??? potassium chloride SR (K-DUR) 20 mEq tablet Take 20 mEq by mouth daily. Take with a meal and a full glass of water.   ??? pramipexole ER(+) (MIRAPEX ER) 4.5 mg tablet Take 1 tablet by mouth twice daily.   ??? torsemide(+) (DEMADEX) 20 mg tablet Take 20 mg by mouth daily.

## 2017-09-10 ENCOUNTER — Encounter: Admit: 2017-09-10 | Discharge: 2017-09-10 | Payer: MEDICARE

## 2017-09-12 ENCOUNTER — Encounter: Admit: 2017-09-12 | Discharge: 2017-09-12 | Payer: MEDICARE

## 2017-09-23 ENCOUNTER — Encounter: Admit: 2017-09-23 | Discharge: 2017-09-23 | Payer: MEDICARE

## 2017-09-23 DIAGNOSIS — E782 Mixed hyperlipidemia: Principal | ICD-10-CM

## 2017-09-23 DIAGNOSIS — I2510 Atherosclerotic heart disease of native coronary artery without angina pectoris: ICD-10-CM

## 2017-09-23 LAB — COMPREHENSIVE METABOLIC PANEL
Lab: 0.8
Lab: 12
Lab: 148 — ABNORMAL HIGH (ref 80–115)
Lab: 20
Lab: 21
Lab: 21 — ABNORMAL HIGH (ref 9.8–20.1)
Lab: 3.2 — ABNORMAL LOW (ref 3.4–4.8)
Lab: 6 — ABNORMAL LOW (ref 6.2–8.1)
Lab: 97
Lab: 0.6
Lab: 105
Lab: 142
Lab: 29
Lab: 3.6
Lab: 9.2

## 2017-09-23 LAB — LIPID PROFILE
Lab: 135 — ABNORMAL LOW (ref 150–200)
Lab: 111

## 2017-09-26 ENCOUNTER — Encounter: Admit: 2017-09-26 | Discharge: 2017-09-26 | Payer: MEDICARE

## 2017-09-26 ENCOUNTER — Ambulatory Visit: Admit: 2017-09-26 | Discharge: 2017-09-27 | Payer: BC Managed Care – PPO

## 2017-09-26 DIAGNOSIS — E782 Mixed hyperlipidemia: ICD-10-CM

## 2017-09-26 DIAGNOSIS — I2510 Atherosclerotic heart disease of native coronary artery without angina pectoris: Principal | ICD-10-CM

## 2017-09-26 DIAGNOSIS — I10 Essential (primary) hypertension: ICD-10-CM

## 2017-09-26 DIAGNOSIS — I493 Ventricular premature depolarization: ICD-10-CM

## 2017-09-26 DIAGNOSIS — E785 Hyperlipidemia, unspecified: ICD-10-CM

## 2017-09-26 DIAGNOSIS — Z951 Presence of aortocoronary bypass graft: ICD-10-CM

## 2017-09-26 DIAGNOSIS — I499 Cardiac arrhythmia, unspecified: Principal | ICD-10-CM

## 2017-09-26 DIAGNOSIS — I480 Paroxysmal atrial fibrillation: ICD-10-CM

## 2017-09-26 DIAGNOSIS — I208 Other forms of angina pectoris: ICD-10-CM

## 2017-09-26 DIAGNOSIS — R600 Localized edema: ICD-10-CM

## 2017-09-26 DIAGNOSIS — E119 Type 2 diabetes mellitus without complications: ICD-10-CM

## 2017-09-26 NOTE — Progress Notes
Date of Service: 09/26/2017    Deborah Fox is a 66 y.o. female.       HPI     Patient is a 66 year old white female that was seen in the office on September 05, 2017.  At that time she did present with symptoms of heart palpitations.  She was evaluated with a 14 days Holter monitor, the results were reviewed today with the patient in the office.  The predominant rhythm was normal sinus, she did have atrial fibrillation that occurred less than 1% of the time with a heart rate ranging between 79- 152 bpm, the longest episode lasted 8 minutes.  In addition,  also isolated supraventricular ectopic beats, ventricular ectopy, bigeminy was also present, there are no episodes of ventricular tachycardia.  The total percentage of ventricular ectopy was approximately 4.1% of the total number of beats.  Patient's CHA2DS2-VASc score is 4.  She is currently on aspirin and clopidogrel.    This patient does have a history of coronary artery disease, she underwent previous surgical revascularization and most recently a PCI of the RCA on May 01, 2017.  She had an episode of recurrent chest pain and she was admitted in August 2018, another left heart catheterization was performed and it revealed patent stent previously placed in the RCA and overall no progression of disease.   in conjunction with this is also a right heart catheterization was performed due to findings of right ventricular dilatation and reduced systolic function on echocardiogram dated 07/29/2017.  The pressure is in the right side of the heart were normal, the cardiac output and cardiac index was also within normal limits.    The main symptom that patient currently is been experiencing is fatigue.  Deborah Fox feels that she is at the point where she might not be able to keep her job.  Any physical activity is overwhelming.  After she underwent a cardiac intervention patient did  complete the cardiac rehab and she continues those exercises. She is not on a beta-blocker or any other antihypertensive drug medication.  Cardiac medications include aspirin and clopidogrel.  She is on several antidiabetic medications.  Repatha was started a few weeks back.  Patient thinks that some of these symptoms could be associated with initiating these anticholesterol medication.         Vitals:    09/26/17 1507   BP: 124/72   Pulse: 67   Weight: 93 kg (205 lb)   Height: 1.524 m (5')     Body mass index is 40.04 kg/m???.     Past Medical History  Patient Active Problem List    Diagnosis Date Noted   ??? PVC's (premature ventricular contractions) 09/05/2017   ??? Bigeminy 09/05/2017   ??? Abnormal EKG 09/05/2017   ??? Statin intolerance 06/07/2017   ??? Bilateral leg edema 06/07/2017   ??? 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 Abnormal thallium stress test 04/03/2017   ??? Hospital discharge follow-up 04/03/2017   ??? 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 Initial neck injections of Botox caused neck extensor weakness.     ??? Type 2 diabetes mellitus (HCC) 04/06/2015   ??? Precordial pain 04/06/2015   ??? 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 Weight gain, abnormal 04/06/2015   ??? Pre-operative cardiovascular examination 04/08/2014   ??? Abdominal pain 06/24/2013   ??? Claudication (HCC) 01/08/2012   ??? Chest pain 12/28/2010   ???  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 CAD (coronary artery disease) 10/06/2009     04/10/17 Cardiac Catheterization by Dr. Chales Abrahams:  1. Significant coronary artery disease manifesting with complete occlusion of the proximal circumflex artery and severe disease of the proximal mid and distal right coronary artery and moderate disease of the left anterior descending.  2. Patent saphenous vein graft to diagonal 1, obtuse marginal 1, obtuse marginal 2, and a jump radial graft to the posterior descending artery.  3. Patent left internal mammary artery to left anterior descending. 4. Severe disease of the right coronary artery that appears to supply a large posterolateral ventricular branch but not the posterior descending artery.  5. Normal left ventricular end-diastolic pressure.  ???     ??? RLS (restless legs syndrome) 10/06/2009   ??? Hypothyroidism 10/06/2009   ??? HTN (hypertension) 10/06/2009   ??? Hyperlipidemia 10/06/2009   ??? GERD (gastroesophageal reflux disease) 10/06/2009   ??? Lichen planus 10/06/2009         Review of Systems   Constitution: Negative.   HENT: Negative.    Eyes: Negative.    Cardiovascular: Negative.    Respiratory: Negative.    Endocrine: Negative.    Hematologic/Lymphatic: Negative.    Skin: Negative.    Musculoskeletal: Negative.    Gastrointestinal: Negative.    Genitourinary: Negative.    Neurological: Negative.    Psychiatric/Behavioral: Negative.    Allergic/Immunologic: Negative.        Physical Exam  General Appearance: obese  Skin: warm, moist, no ulcers or xanthomas  Eyes: conjunctivae and lids normal, pupils are equal and round  Lips & Oral Mucosa: no pallor or cyanosis  Neck Veins: neck veins are flat, neck veins are not distended  Chest Inspection: chest is normal in appearance  Respiratory Effort: breathing comfortably, no respiratory distress  Auscultation/Percussion: lungs clear to auscultation, no rales or rhonchi, no wheezing  Cardiac Rhythm: regular rhythm and normal rate  Cardiac Auscultation: S1, S2 normal, no rub, no gallop  Murmurs: no murmur  Carotid Arteries: normal carotid upstroke bilaterally, no bruit  Abdominal aorta: could not be examined due to obese adomen  Lower Extremity Edema: no lower extremity edema  Abdominal Exam: soft, non-tender, no masses, bowel sounds normal  Liver & Spleen: no organomegaly  Language and Memory: patient responsive and seems to comprehend information  Neurologic Exam: neurological assessment grossly intact        Cardiovascular Studies      Problems Addressed Today  Encounter Diagnoses   Name Primary? ??? Bigeminy Yes   ??? Coronary artery disease involving native coronary artery of native heart without angina pectoris    ??? Essential hypertension    ??? Hx of CABG    ??? Mixed hyperlipidemia    ??? Other forms of angina pectoris (HCC)    ??? PVC's (premature ventricular contractions)    ??? Bilateral leg edema    ??? Paroxysmal atrial fibrillation (HCC)        Assessment and Plan     In summary: This is a 66 year old white female with a well-known history of coronary artery disease, status post previous surgical intervention, status post PCI to RCA on 05/01/2017, status post repeat right and left heart catheterization on July 30, 2017 (the stents were open, cardiac output and cardiac index were also unremarkable and the pulmonary artery pressure was normal), history of symptomatic heart palpitations with overall low burden atrial fibrillation on the most recent Holter monitor and ventricular ectopy which represented 4.1%  of the total number of beats, overwhelming symptoms of fatigue some what coincidental with the initiation of Repatha, poorly controlled diabetes mellitus type 2 on several antidiabetics with the last known hemoglobin A1c of 7.1%.    In the light of the above I recommend the following:  ??? I asked the patient to skip the next Repatha injection and see whether symptoms of fatigue will improve (she is to to get the next injection this coming Sunday, September 29, 2017).  If she will not notice any improvement in 1 week then patient will get the next injection of Repatha the following Sunday which is October 08, 2017.  ??? I also suggested further evaluation with Dr. Betti Cruz in our [facility] office for the abnormal findings on the Holter monitor (atrial fibrillation of ventricular ectopy).  ??? I also suggest to increase the level of physical activity and continue physical therapy  ??? Due to this persistent symptoms of fatigue, one might consider further neurological and/or rheumatological evaluation ??? I anticipate to see this patient back in the office in approximately 6 months             Current Medications (including today's revisions)  ??? aspirin EC 81 mg tablet Take 1 tablet by mouth daily. Take with food.   ??? clindamycin(+) (CLEOCIN) 300 mg capsule Take 600 mg by mouth. Take before dentist appointments.   ??? clopiDOGrel (PLAVIX) 75 mg tablet Take 1 tablet by mouth daily.   ??? cyanocobalamin (VITAMIN B-12, RUBRAMIN) 1,000 mcg/mL injection Inject 1 mL into the muscle every 30 days.   ??? evolocumab (REPATHA) 140 mg/mL injectable PEN Inject 1 mL under the skin every 14 days.   ??? gabapentin (NEURONTIN) 300 mg capsule Take 300 mg by mouth at bedtime daily.   ??? glimepiride (AMARYL) 4 mg tablet Take 8 mg by mouth daily with breakfast.   ??? levothyroxine (SYNTHROID) 75 mcg tablet Take 75 mcg by mouth daily.   ??? magnesium oxide (MAG-OX) 400 mg tablet Take 400 mg by mouth daily.   ??? nitroglycerin (NITROSTAT) 0.4 mg tablet Take one tablet sublingually three times five minutes apart for chest pain.   ??? pantoprazole DR (PROTONIX) 40 mg tablet Take 40 mg by mouth twice daily.   ??? pioglitazone (ACTOS) 30 mg tablet Take 30 mg by mouth daily.   ??? potassium chloride SR (K-DUR) 20 mEq tablet Take 20 mEq by mouth daily. Take with a meal and a full glass of water.   ??? pramipexole ER(+) (MIRAPEX ER) 4.5 mg tablet Take 1 tablet by mouth twice daily.   ??? torsemide(+) (DEMADEX) 20 mg tablet Alternate 20mg  and 40mg  every other day (Patient taking differently: daily. Alternate 20mg )

## 2017-10-03 ENCOUNTER — Encounter: Admit: 2017-10-03 | Discharge: 2017-10-03 | Payer: MEDICARE

## 2017-10-15 ENCOUNTER — Encounter: Admit: 2017-10-15 | Discharge: 2017-10-15 | Payer: MEDICARE

## 2017-10-15 MED ORDER — CLOPIDOGREL 75 MG PO TAB
75 mg | ORAL_TABLET | Freq: Every day | ORAL | 3 refills | 90.00000 days | Status: AC
Start: 2017-10-15 — End: 2018-10-09

## 2017-11-06 ENCOUNTER — Encounter: Admit: 2017-11-06 | Discharge: 2017-11-06 | Payer: MEDICARE

## 2017-11-06 ENCOUNTER — Ambulatory Visit: Admit: 2017-11-06 | Discharge: 2017-11-07 | Payer: BC Managed Care – PPO

## 2017-11-06 DIAGNOSIS — K219 Gastro-esophageal reflux disease without esophagitis: ICD-10-CM

## 2017-11-06 DIAGNOSIS — I208 Other forms of angina pectoris: ICD-10-CM

## 2017-11-06 DIAGNOSIS — E119 Type 2 diabetes mellitus without complications: ICD-10-CM

## 2017-11-06 DIAGNOSIS — R600 Localized edema: ICD-10-CM

## 2017-11-06 DIAGNOSIS — I10 Essential (primary) hypertension: ICD-10-CM

## 2017-11-06 DIAGNOSIS — Z96651 Presence of right artificial knee joint: ICD-10-CM

## 2017-11-06 DIAGNOSIS — E782 Mixed hyperlipidemia: ICD-10-CM

## 2017-11-06 DIAGNOSIS — I2510 Atherosclerotic heart disease of native coronary artery without angina pectoris: Principal | ICD-10-CM

## 2017-11-06 DIAGNOSIS — M79604 Pain in right leg: ICD-10-CM

## 2017-11-06 DIAGNOSIS — E1140 Type 2 diabetes mellitus with diabetic neuropathy, unspecified: ICD-10-CM

## 2017-11-06 DIAGNOSIS — I480 Paroxysmal atrial fibrillation: ICD-10-CM

## 2017-11-06 DIAGNOSIS — R0600 Dyspnea, unspecified: ICD-10-CM

## 2017-11-06 DIAGNOSIS — I493 Ventricular premature depolarization: ICD-10-CM

## 2017-11-06 DIAGNOSIS — I499 Cardiac arrhythmia, unspecified: ICD-10-CM

## 2017-11-06 DIAGNOSIS — E785 Hyperlipidemia, unspecified: ICD-10-CM

## 2017-11-06 DIAGNOSIS — Z951 Presence of aortocoronary bypass graft: ICD-10-CM

## 2017-11-13 ENCOUNTER — Encounter: Admit: 2017-11-13 | Discharge: 2017-11-13 | Payer: MEDICARE

## 2017-11-13 NOTE — Telephone Encounter
-----   Message from Jenel LucksStephanie Wynn, RN sent at 11/06/2017  4:22 PM CST -----  Regarding: can you clal and set up PFTs at [facility] please next week  Orders are in. She is aware.     You are the bestest

## 2017-11-15 ENCOUNTER — Ambulatory Visit: Admit: 2017-11-15 | Discharge: 2017-11-16 | Payer: BC Managed Care – PPO

## 2017-11-15 DIAGNOSIS — R600 Localized edema: ICD-10-CM

## 2017-11-15 DIAGNOSIS — M79604 Pain in right leg: Principal | ICD-10-CM

## 2018-01-22 LAB — LACTIC ACID(LACTATE): Lab: 1.2

## 2018-01-22 LAB — COMPREHENSIVE METABOLIC PANEL
Lab: 138
Lab: 14
Lab: 3.6
Lab: 61

## 2018-01-22 LAB — CBC: Lab: 5.8

## 2018-01-27 LAB — THYROID STIMULATING HORMONE-TSH: Lab: 1.9 — ABNORMAL HIGH (ref 9.8–20.1)

## 2018-01-27 LAB — BASIC METABOLIC PANEL
Lab: 0.8
Lab: 12
Lab: 137
Lab: 216 — ABNORMAL HIGH (ref 80–115)
Lab: 22 — ABNORMAL HIGH (ref 9.8–20.1)
Lab: 28 — ABNORMAL HIGH (ref 80–115)
Lab: 74
Lab: 9.6 — ABNORMAL LOW (ref 3.4–4.8)

## 2018-01-27 LAB — HEMOGLOBIN A1C: Lab: 9.6 — ABNORMAL HIGH (ref 4.5–6.5)

## 2018-03-20 ENCOUNTER — Encounter: Admit: 2018-03-20 | Discharge: 2018-03-20 | Payer: MEDICARE

## 2018-03-20 DIAGNOSIS — I2510 Atherosclerotic heart disease of native coronary artery without angina pectoris: Principal | ICD-10-CM

## 2018-03-20 DIAGNOSIS — E785 Hyperlipidemia, unspecified: ICD-10-CM

## 2018-03-20 DIAGNOSIS — I10 Essential (primary) hypertension: ICD-10-CM

## 2018-03-20 DIAGNOSIS — E119 Type 2 diabetes mellitus without complications: ICD-10-CM

## 2018-04-03 ENCOUNTER — Ambulatory Visit: Admit: 2018-04-03 | Discharge: 2018-04-04 | Payer: BC Managed Care – PPO

## 2018-04-03 ENCOUNTER — Encounter: Admit: 2018-04-03 | Discharge: 2018-04-03 | Payer: MEDICARE

## 2018-04-03 DIAGNOSIS — I10 Essential (primary) hypertension: ICD-10-CM

## 2018-04-03 DIAGNOSIS — Z951 Presence of aortocoronary bypass graft: ICD-10-CM

## 2018-04-03 DIAGNOSIS — E119 Type 2 diabetes mellitus without complications: ICD-10-CM

## 2018-04-03 DIAGNOSIS — R0600 Dyspnea, unspecified: ICD-10-CM

## 2018-04-03 DIAGNOSIS — I2510 Atherosclerotic heart disease of native coronary artery without angina pectoris: Principal | ICD-10-CM

## 2018-04-03 DIAGNOSIS — G243 Spasmodic torticollis: ICD-10-CM

## 2018-04-03 DIAGNOSIS — I480 Paroxysmal atrial fibrillation: ICD-10-CM

## 2018-04-03 DIAGNOSIS — R9439 Abnormal result of other cardiovascular function study: ICD-10-CM

## 2018-04-03 DIAGNOSIS — R600 Localized edema: ICD-10-CM

## 2018-04-03 DIAGNOSIS — E1140 Type 2 diabetes mellitus with diabetic neuropathy, unspecified: ICD-10-CM

## 2018-04-03 DIAGNOSIS — I499 Cardiac arrhythmia, unspecified: ICD-10-CM

## 2018-04-03 DIAGNOSIS — E785 Hyperlipidemia, unspecified: ICD-10-CM

## 2018-04-10 ENCOUNTER — Encounter: Admit: 2018-04-10 | Discharge: 2018-04-10 | Payer: MEDICARE

## 2018-04-18 ENCOUNTER — Ambulatory Visit: Admit: 2018-04-18 | Discharge: 2018-04-19 | Payer: BC Managed Care – PPO

## 2018-04-18 DIAGNOSIS — I10 Essential (primary) hypertension: ICD-10-CM

## 2018-04-18 DIAGNOSIS — I2510 Atherosclerotic heart disease of native coronary artery without angina pectoris: Principal | ICD-10-CM

## 2018-04-18 DIAGNOSIS — I480 Paroxysmal atrial fibrillation: ICD-10-CM

## 2018-04-18 MED ORDER — PERFLUTREN LIPID MICROSPHERES 1.1 MG/ML IV SUSP
1-20 mL | Freq: Once | INTRAVENOUS | 0 refills | Status: CP | PRN
Start: 2018-04-18 — End: ?

## 2018-04-28 ENCOUNTER — Encounter: Admit: 2018-04-28 | Discharge: 2018-04-28 | Payer: MEDICARE

## 2018-04-30 ENCOUNTER — Encounter: Admit: 2018-04-30 | Discharge: 2018-04-30 | Payer: MEDICARE

## 2018-04-30 MED ORDER — REPATHA SURECLICK 140 MG/ML SC PNIJ
SUBCUTANEOUS | 11 refills | 28.00000 days | Status: AC
Start: 2018-04-30 — End: 2018-06-03

## 2018-05-05 ENCOUNTER — Encounter: Admit: 2018-05-05 | Discharge: 2018-05-05 | Payer: MEDICARE

## 2018-05-05 MED ORDER — REPATHA SURECLICK 140 MG/ML SC PNIJ
2 refills
Start: 2018-05-05 — End: ?

## 2018-06-03 ENCOUNTER — Encounter: Admit: 2018-06-03 | Discharge: 2018-06-03 | Payer: MEDICARE

## 2018-06-03 MED ORDER — EVOLOCUMAB 140 MG/ML SC PNIJ
140 mg | SUBCUTANEOUS | 3 refills | 28.00000 days | Status: AC
Start: 2018-06-03 — End: 2018-06-13

## 2018-06-11 ENCOUNTER — Encounter: Admit: 2018-06-11 | Discharge: 2018-06-11 | Payer: MEDICARE

## 2018-06-13 ENCOUNTER — Encounter: Admit: 2018-06-13 | Discharge: 2018-06-13 | Payer: MEDICARE

## 2018-06-13 MED ORDER — EVOLOCUMAB 140 MG/ML SC PNIJ
140 mg | SUBCUTANEOUS | 4 refills | 28.00000 days | Status: AC
Start: 2018-06-13 — End: 2018-10-29

## 2018-06-15 ENCOUNTER — Encounter: Admit: 2018-06-15 | Discharge: 2018-06-15 | Payer: MEDICARE

## 2018-06-16 ENCOUNTER — Encounter: Admit: 2018-06-16 | Discharge: 2018-06-16 | Payer: MEDICARE

## 2018-06-16 MED ORDER — CLOPIDOGREL 75 MG PO TAB
ORAL_TABLET | Freq: Every day | ORAL | 3 refills | 90.00000 days | Status: AC
Start: 2018-06-16 — End: 2018-11-24

## 2018-06-17 ENCOUNTER — Encounter: Admit: 2018-06-17 | Discharge: 2018-06-17 | Payer: MEDICARE

## 2018-06-18 ENCOUNTER — Encounter: Admit: 2018-06-18 | Discharge: 2018-06-18 | Payer: MEDICARE

## 2018-06-23 ENCOUNTER — Encounter: Admit: 2018-06-23 | Discharge: 2018-06-23 | Payer: MEDICARE

## 2018-06-25 ENCOUNTER — Encounter: Admit: 2018-06-25 | Discharge: 2018-06-25 | Payer: MEDICARE

## 2018-06-27 ENCOUNTER — Encounter: Admit: 2018-06-27 | Discharge: 2018-06-27 | Payer: MEDICARE

## 2018-06-30 ENCOUNTER — Encounter: Admit: 2018-06-30 | Discharge: 2018-06-30 | Payer: MEDICARE

## 2018-07-15 ENCOUNTER — Encounter: Admit: 2018-07-15 | Discharge: 2018-07-15 | Payer: MEDICARE

## 2018-08-11 ENCOUNTER — Encounter: Admit: 2018-08-11 | Discharge: 2018-08-11 | Payer: MEDICARE

## 2018-08-12 ENCOUNTER — Encounter: Admit: 2018-08-12 | Discharge: 2018-08-12 | Payer: MEDICARE

## 2018-08-12 ENCOUNTER — Ambulatory Visit: Admit: 2018-08-12 | Discharge: 2018-08-13 | Payer: BC Managed Care – PPO

## 2018-08-12 DIAGNOSIS — I10 Essential (primary) hypertension: ICD-10-CM

## 2018-08-12 DIAGNOSIS — R0600 Dyspnea, unspecified: ICD-10-CM

## 2018-08-12 DIAGNOSIS — I2510 Atherosclerotic heart disease of native coronary artery without angina pectoris: Principal | ICD-10-CM

## 2018-08-12 DIAGNOSIS — E785 Hyperlipidemia, unspecified: ICD-10-CM

## 2018-08-12 DIAGNOSIS — Z951 Presence of aortocoronary bypass graft: Principal | ICD-10-CM

## 2018-08-12 DIAGNOSIS — E119 Type 2 diabetes mellitus without complications: ICD-10-CM

## 2018-08-12 DIAGNOSIS — I493 Ventricular premature depolarization: ICD-10-CM

## 2018-10-09 ENCOUNTER — Encounter: Admit: 2018-10-09 | Discharge: 2018-10-09 | Payer: MEDICARE

## 2018-10-09 ENCOUNTER — Ambulatory Visit: Admit: 2018-10-09 | Discharge: 2018-10-10 | Payer: BC Managed Care – PPO

## 2018-10-09 DIAGNOSIS — E785 Hyperlipidemia, unspecified: ICD-10-CM

## 2018-10-09 DIAGNOSIS — I499 Cardiac arrhythmia, unspecified: ICD-10-CM

## 2018-10-09 DIAGNOSIS — R9439 Abnormal result of other cardiovascular function study: ICD-10-CM

## 2018-10-09 DIAGNOSIS — Z9884 Bariatric surgery status: ICD-10-CM

## 2018-10-09 DIAGNOSIS — I10 Essential (primary) hypertension: ICD-10-CM

## 2018-10-09 DIAGNOSIS — R635 Abnormal weight gain: ICD-10-CM

## 2018-10-09 DIAGNOSIS — E782 Mixed hyperlipidemia: ICD-10-CM

## 2018-10-09 DIAGNOSIS — G243 Spasmodic torticollis: ICD-10-CM

## 2018-10-09 DIAGNOSIS — I2510 Atherosclerotic heart disease of native coronary artery without angina pectoris: Principal | ICD-10-CM

## 2018-10-09 DIAGNOSIS — I493 Ventricular premature depolarization: ICD-10-CM

## 2018-10-09 DIAGNOSIS — E119 Type 2 diabetes mellitus without complications: ICD-10-CM

## 2018-10-09 DIAGNOSIS — Z951 Presence of aortocoronary bypass graft: ICD-10-CM

## 2018-10-09 DIAGNOSIS — I480 Paroxysmal atrial fibrillation: Principal | ICD-10-CM

## 2018-10-17 ENCOUNTER — Encounter: Admit: 2018-10-17 | Discharge: 2018-10-17 | Payer: MEDICARE

## 2018-10-21 ENCOUNTER — Ambulatory Visit: Admit: 2018-10-21 | Discharge: 2018-10-21 | Payer: BC Managed Care – PPO

## 2018-10-21 ENCOUNTER — Ambulatory Visit: Admit: 2018-10-21 | Discharge: 2018-10-21 | Payer: MEDICARE

## 2018-10-21 ENCOUNTER — Encounter: Admit: 2018-10-21 | Discharge: 2018-10-21 | Payer: MEDICARE

## 2018-10-21 DIAGNOSIS — I480 Paroxysmal atrial fibrillation: Principal | ICD-10-CM

## 2018-10-21 DIAGNOSIS — I10 Essential (primary) hypertension: ICD-10-CM

## 2018-10-21 DIAGNOSIS — I2510 Atherosclerotic heart disease of native coronary artery without angina pectoris: ICD-10-CM

## 2018-10-21 MED ORDER — PERFLUTREN LIPID MICROSPHERES 1.1 MG/ML IV SUSP
1-20 mL | Freq: Once | INTRAVENOUS | 0 refills | Status: CP | PRN
Start: 2018-10-21 — End: ?
  Administered 2018-10-21: 20:00:00 2 mL via INTRAVENOUS

## 2018-10-24 ENCOUNTER — Encounter: Admit: 2018-10-24 | Discharge: 2018-10-24 | Payer: MEDICARE

## 2018-10-29 ENCOUNTER — Encounter: Admit: 2018-10-29 | Discharge: 2018-10-29 | Payer: MEDICARE

## 2018-10-29 MED ORDER — EVOLOCUMAB 140 MG/ML SC PNIJ
140 mg | SUBCUTANEOUS | 4 refills | 28.00000 days | Status: AC
Start: 2018-10-29 — End: 2019-09-02

## 2018-11-12 ENCOUNTER — Encounter: Admit: 2018-11-12 | Discharge: 2018-11-12 | Payer: MEDICARE

## 2018-11-17 ENCOUNTER — Encounter: Admit: 2018-11-17 | Discharge: 2018-11-17 | Payer: MEDICARE

## 2018-11-17 MED ORDER — APIXABAN 5 MG PO TAB
5 mg | ORAL_TABLET | Freq: Two times a day (BID) | ORAL | 11 refills | Status: AC
Start: 2018-11-17 — End: ?

## 2018-11-18 ENCOUNTER — Encounter: Admit: 2018-11-18 | Discharge: 2018-11-18 | Payer: MEDICARE

## 2018-11-18 ENCOUNTER — Ambulatory Visit: Admit: 2018-11-18 | Discharge: 2018-11-19 | Payer: BC Managed Care – PPO

## 2018-11-18 DIAGNOSIS — E785 Hyperlipidemia, unspecified: ICD-10-CM

## 2018-11-18 DIAGNOSIS — E119 Type 2 diabetes mellitus without complications: ICD-10-CM

## 2018-11-18 DIAGNOSIS — I2510 Atherosclerotic heart disease of native coronary artery without angina pectoris: Principal | ICD-10-CM

## 2018-11-18 DIAGNOSIS — I10 Essential (primary) hypertension: ICD-10-CM

## 2018-11-19 ENCOUNTER — Encounter: Admit: 2018-11-19 | Discharge: 2018-11-19 | Payer: MEDICARE

## 2018-11-19 DIAGNOSIS — I493 Ventricular premature depolarization: ICD-10-CM

## 2018-11-19 DIAGNOSIS — I480 Paroxysmal atrial fibrillation: Principal | ICD-10-CM

## 2018-11-19 DIAGNOSIS — R001 Bradycardia, unspecified: ICD-10-CM

## 2018-11-19 DIAGNOSIS — I499 Cardiac arrhythmia, unspecified: ICD-10-CM

## 2018-11-19 MED ORDER — SODIUM CHLORIDE 0.9 % IV SOLP
INTRAVENOUS | 0 refills | Status: CN
Start: 2018-11-19 — End: ?

## 2018-11-19 MED ORDER — LIDOCAINE (PF) 10 MG/ML (1 %) IJ SOLN
.1-2 mL | INTRAMUSCULAR | 0 refills | Status: CN | PRN
Start: 2018-11-19 — End: ?

## 2018-11-20 ENCOUNTER — Encounter: Admit: 2018-11-20 | Discharge: 2018-11-20 | Payer: MEDICARE

## 2018-11-24 ENCOUNTER — Encounter: Admit: 2018-11-24 | Discharge: 2018-11-24 | Payer: MEDICARE

## 2018-11-24 MED ORDER — ASPIRIN 81 MG PO CHEW
81 mg | ORAL_TABLET | Freq: Every day | ORAL | 0 refills | Status: SS
Start: 2018-11-24 — End: 2018-12-13

## 2018-11-28 LAB — BASIC METABOLIC PANEL
Lab: 0.6 10*3/uL — ABNORMAL LOW (ref 0–0.20)
Lab: 111 K/UL — ABNORMAL LOW (ref 1.0–4.8)
Lab: 143 % (ref >60)
Lab: 15 10*3/uL — ABNORMAL LOW (ref 150–400)
Lab: 166 mg/dL — ABNORMAL HIGH (ref 60–99)
Lab: 3.9 K/UL (ref 1.8–7.0)
Lab: 30 10*3/uL — ABNORMAL LOW (ref 0–0.80)
Lab: 9.2 mg/dL — ABNORMAL HIGH (ref 7–25)
Lab: >60 mg/dL — ABNORMAL HIGH (ref 0.4–1.00)
Lab: >60 mg/dL — ABNORMAL HIGH (ref 8.5–10.6)

## 2018-11-28 LAB — CBC
Lab: 13 % (ref >60)
Lab: 4.4 % (ref 4–12)
Lab: 7 % — ABNORMAL LOW (ref 24–44)

## 2018-11-28 LAB — MAGNESIUM: Lab: 2

## 2018-12-01 ENCOUNTER — Encounter: Admit: 2018-12-01 | Discharge: 2018-12-01 | Payer: MEDICARE

## 2018-12-01 DIAGNOSIS — I480 Paroxysmal atrial fibrillation: Principal | ICD-10-CM

## 2018-12-01 DIAGNOSIS — I493 Ventricular premature depolarization: ICD-10-CM

## 2018-12-01 DIAGNOSIS — I499 Cardiac arrhythmia, unspecified: ICD-10-CM

## 2018-12-02 ENCOUNTER — Ambulatory Visit: Admit: 2018-12-02 | Discharge: 2018-12-02 | Payer: BC Managed Care – PPO

## 2018-12-02 ENCOUNTER — Encounter: Admit: 2018-12-02 | Discharge: 2018-12-02 | Payer: MEDICARE

## 2018-12-02 DIAGNOSIS — R600 Localized edema: ICD-10-CM

## 2018-12-02 DIAGNOSIS — E785 Hyperlipidemia, unspecified: ICD-10-CM

## 2018-12-02 DIAGNOSIS — I480 Paroxysmal atrial fibrillation: ICD-10-CM

## 2018-12-02 DIAGNOSIS — Z789 Other specified health status: ICD-10-CM

## 2018-12-02 DIAGNOSIS — Z9884 Bariatric surgery status: ICD-10-CM

## 2018-12-02 DIAGNOSIS — I2510 Atherosclerotic heart disease of native coronary artery without angina pectoris: Principal | ICD-10-CM

## 2018-12-02 DIAGNOSIS — Z951 Presence of aortocoronary bypass graft: ICD-10-CM

## 2018-12-02 DIAGNOSIS — I10 Essential (primary) hypertension: ICD-10-CM

## 2018-12-02 DIAGNOSIS — E1140 Type 2 diabetes mellitus with diabetic neuropathy, unspecified: ICD-10-CM

## 2018-12-02 DIAGNOSIS — Z9889 Other specified postprocedural states: ICD-10-CM

## 2018-12-02 DIAGNOSIS — I499 Cardiac arrhythmia, unspecified: Principal | ICD-10-CM

## 2018-12-02 DIAGNOSIS — E119 Type 2 diabetes mellitus without complications: ICD-10-CM

## 2018-12-03 MED ORDER — NITROGLYCERIN 0.4 MG SL SUBL
ORAL_TABLET | SUBLINGUAL | 3 refills | 9.00000 days | Status: AC
Start: 2018-12-03 — End: 2020-02-19

## 2018-12-04 ENCOUNTER — Encounter: Admit: 2018-12-04 | Discharge: 2018-12-04 | Payer: MEDICARE

## 2018-12-05 ENCOUNTER — Encounter: Admit: 2018-12-05 | Discharge: 2018-12-05 | Payer: MEDICARE

## 2018-12-05 MED ORDER — DOCUSATE SODIUM 100 MG PO CAP
100 mg | Freq: Every day | ORAL | 0 refills | Status: CN | PRN
Start: 2018-12-05 — End: ?

## 2018-12-05 MED ORDER — TEMAZEPAM 15 MG PO CAP
15 mg | Freq: Every evening | ORAL | 0 refills | Status: CN | PRN
Start: 2018-12-05 — End: ?

## 2018-12-05 MED ORDER — ALUMINUM-MAGNESIUM HYDROXIDE 200-200 MG/5 ML PO SUSP
30 mL | ORAL | 0 refills | Status: CN | PRN
Start: 2018-12-05 — End: ?

## 2018-12-08 ENCOUNTER — Encounter: Admit: 2018-12-08 | Discharge: 2018-12-08 | Payer: MEDICARE

## 2018-12-11 ENCOUNTER — Encounter: Admit: 2018-12-11 | Discharge: 2018-12-11 | Payer: MEDICARE

## 2018-12-11 DIAGNOSIS — I2510 Atherosclerotic heart disease of native coronary artery without angina pectoris: Principal | ICD-10-CM

## 2018-12-11 DIAGNOSIS — E785 Hyperlipidemia, unspecified: ICD-10-CM

## 2018-12-11 DIAGNOSIS — I10 Essential (primary) hypertension: ICD-10-CM

## 2018-12-11 DIAGNOSIS — E119 Type 2 diabetes mellitus without complications: ICD-10-CM

## 2018-12-11 LAB — BASIC METABOLIC PANEL
Lab: 0.6 mg/dL (ref 0.4–1.00)
Lab: 106 MMOL/L (ref 98–110)
Lab: 110 mg/dL — ABNORMAL HIGH (ref 70–100)
Lab: 144 MMOL/L (ref 137–147)
Lab: 18 mg/dL (ref 7–25)
Lab: 30 MMOL/L (ref 21–30)
Lab: 8 K/UL (ref 3–12)
Lab: >60 mL/min (ref >60)
Lab: >60 mL/min (ref >60)

## 2018-12-11 LAB — MAGNESIUM: Lab: 1.8 mg/dL — ABNORMAL LOW (ref 1.6–2.6)

## 2018-12-11 LAB — POC GLUCOSE
Lab: 110 mg/dL — ABNORMAL HIGH (ref 70–100)
Lab: 88 mg/dL (ref 70–100)
Lab: 163 mg/dL — ABNORMAL HIGH (ref 70–100)

## 2018-12-11 MED ORDER — SODIUM CHLORIDE 0.9 % IV SOLP
INTRAVENOUS | 0 refills | Status: DC
Start: 2018-12-11 — End: 2018-12-12
  Administered 2018-12-11: 13:00:00 1000.000 mL via INTRAVENOUS

## 2018-12-11 MED ORDER — POTASSIUM CHLORIDE 10 MEQ PO TBTQ
10 meq | Freq: Every day | ORAL | 0 refills | Status: DC
Start: 2018-12-11 — End: 2018-12-12

## 2018-12-11 MED ORDER — PRAMIPEXOLE 4.5 MG PO TB24
4.5 mg | Freq: Two times a day (BID) | ORAL | 0 refills | Status: DC
Start: 2018-12-11 — End: 2018-12-11

## 2018-12-11 MED ORDER — APIXABAN 5 MG PO TAB
5 mg | Freq: Two times a day (BID) | ORAL | 0 refills | Status: DC
Start: 2018-12-11 — End: 2018-12-13
  Administered 2018-12-12 – 2018-12-13 (×4): 5 mg via ORAL

## 2018-12-11 MED ORDER — PANTOPRAZOLE 40 MG PO TBEC
40 mg | Freq: Two times a day (BID) | ORAL | 0 refills | Status: DC
Start: 2018-12-11 — End: 2018-12-13
  Administered 2018-12-12 – 2018-12-13 (×4): 40 mg via ORAL

## 2018-12-11 MED ORDER — POTASSIUM CHLORIDE 20 MEQ PO TBTQ
60 meq | Freq: Once | ORAL | 0 refills | Status: CP
Start: 2018-12-11 — End: ?
  Administered 2018-12-11: 17:00:00 60 meq via ORAL

## 2018-12-11 MED ORDER — MAGNESIUM SULFATE IN D5W 1 GRAM/100 ML IV PGBK
1 g | Freq: Once | INTRAVENOUS | 0 refills | Status: CP
Start: 2018-12-11 — End: ?
  Administered 2018-12-11: 16:00:00 1 g via INTRAVENOUS

## 2018-12-11 MED ORDER — DOCUSATE SODIUM 100 MG PO CAP
100 mg | Freq: Every day | ORAL | 0 refills | Status: DC | PRN
Start: 2018-12-11 — End: 2018-12-13

## 2018-12-11 MED ORDER — LIDOCAINE (PF) 10 MG/ML (1 %) IJ SOLN
.1-2 mL | INTRAMUSCULAR | 0 refills | Status: DC | PRN
Start: 2018-12-11 — End: 2018-12-12

## 2018-12-11 MED ORDER — INSULIN GLARGINE 100 UNIT/ML (3 ML) SC INJ PEN
40 [IU] | Freq: Every evening | SUBCUTANEOUS | 0 refills | Status: DC
Start: 2018-12-11 — End: 2018-12-13
  Administered 2018-12-12: 04:00:00 20 [IU] via SUBCUTANEOUS

## 2018-12-11 MED ORDER — SOTALOL 120 MG PO TAB
120 mg | ORAL | 0 refills | Status: DC
Start: 2018-12-11 — End: 2018-12-11
  Administered 2018-12-11: 17:00:00 120 mg via ORAL

## 2018-12-11 MED ORDER — ASPIRIN 81 MG PO CHEW
81 mg | Freq: Every day | ORAL | 0 refills | Status: DC
Start: 2018-12-11 — End: 2018-12-13
  Administered 2018-12-13: 03:00:00 81 mg via ORAL

## 2018-12-11 MED ORDER — METOCLOPRAMIDE HCL 5 MG/ML IJ SOLN
5 mg | Freq: Once | INTRAVENOUS | 0 refills | Status: CP
Start: 2018-12-11 — End: ?
  Administered 2018-12-11: 5 mg via INTRAVENOUS

## 2018-12-11 MED ORDER — SOTALOL 120 MG PO TAB
120 mg | ORAL | 0 refills | Status: CN
Start: 2018-12-11 — End: ?

## 2018-12-11 MED ORDER — HELP MEDICATION
Freq: Two times a day (BID) | 0 refills | Status: DC
Start: 2018-12-11 — End: 2018-12-11

## 2018-12-11 MED ORDER — ALUMINUM-MAGNESIUM HYDROXIDE 200-200 MG/5 ML PO SUSP
30 mL | ORAL | 0 refills | Status: DC | PRN
Start: 2018-12-11 — End: 2018-12-13

## 2018-12-11 MED ORDER — GABAPENTIN 300 MG PO CAP
300 mg | Freq: Every evening | ORAL | 0 refills | Status: DC
Start: 2018-12-11 — End: 2018-12-13
  Administered 2018-12-13: 03:00:00 300 mg via ORAL

## 2018-12-11 MED ORDER — ACETAMINOPHEN 325 MG PO TAB
650 mg | ORAL | 0 refills | Status: DC | PRN
Start: 2018-12-11 — End: 2018-12-13
  Administered 2018-12-11: 21:00:00 650 mg via ORAL

## 2018-12-11 MED ORDER — MAGNESIUM OXIDE 400 MG (241.3 MG MAGNESIUM) PO TAB
400 mg | Freq: Every day | ORAL | 0 refills | Status: DC
Start: 2018-12-11 — End: 2018-12-13
  Administered 2018-12-11 – 2018-12-13 (×3): 400 mg via ORAL

## 2018-12-11 MED ORDER — SOTALOL 120 MG PO TAB
120 mg | ORAL | 0 refills | Status: DC
Start: 2018-12-11 — End: 2018-12-12
  Administered 2018-12-12 (×2): 120 mg via ORAL

## 2018-12-11 MED ORDER — VANCOMYCIN 1,500 MG IVPB
15 mg/kg | Freq: Once | INTRAVENOUS | 0 refills | Status: CP
Start: 2018-12-11 — End: ?

## 2018-12-11 MED ORDER — LEVOTHYROXINE 75 MCG PO TAB
75 ug | Freq: Every day | ORAL | 0 refills | Status: DC
Start: 2018-12-11 — End: 2018-12-13
  Administered 2018-12-12 – 2018-12-13 (×2): 75 ug via ORAL

## 2018-12-11 MED ORDER — TEMAZEPAM 15 MG PO CAP
15 mg | Freq: Every evening | ORAL | 0 refills | Status: DC | PRN
Start: 2018-12-11 — End: 2018-12-12

## 2018-12-11 MED ORDER — IMS MIXTURE TEMPLATE
4.5 mg | Freq: Two times a day (BID) | ORAL | 0 refills | Status: DC
Start: 2018-12-11 — End: 2018-12-13
  Administered 2018-12-12 – 2018-12-13 (×7): 4.5 mg via ORAL

## 2018-12-11 MED ORDER — INSULIN ASPART 100 UNIT/ML SC FLEXPEN
0-12 [IU] | Freq: Before meals | SUBCUTANEOUS | 0 refills | Status: DC
Start: 2018-12-11 — End: 2018-12-13

## 2018-12-12 ENCOUNTER — Inpatient Hospital Stay: Admit: 2018-12-12 | Discharge: 2018-12-12 | Payer: MEDICARE

## 2018-12-12 ENCOUNTER — Encounter: Admit: 2018-12-12 | Discharge: 2018-12-12 | Payer: MEDICARE

## 2018-12-12 DIAGNOSIS — Z950 Presence of cardiac pacemaker: ICD-10-CM

## 2018-12-12 DIAGNOSIS — I495 Sick sinus syndrome: Secondary | ICD-10-CM

## 2018-12-12 DIAGNOSIS — I480 Paroxysmal atrial fibrillation: Principal | ICD-10-CM

## 2018-12-12 LAB — POC GLUCOSE
Lab: 269 mg/dL — ABNORMAL HIGH (ref 70–100)
Lab: 85 mg/dL (ref 70–100)
Lab: 176 mg/dL — ABNORMAL HIGH (ref 70–100)

## 2018-12-12 LAB — MAGNESIUM: Lab: 1.9 mg/dL — ABNORMAL LOW (ref 1.6–2.6)

## 2018-12-12 LAB — CBC AND DIFF
Lab: 4.3 M/UL — ABNORMAL LOW (ref 4.0–5.0)
Lab: 5.9 K/UL — ABNORMAL LOW (ref 4.5–11.0)

## 2018-12-12 LAB — BASIC METABOLIC PANEL: Lab: 145 MMOL/L — ABNORMAL LOW (ref >60)

## 2018-12-12 MED ORDER — ONDANSETRON HCL (PF) 4 MG/2 ML IJ SOLN
4 mg | INTRAVENOUS | 0 refills | Status: DC | PRN
Start: 2018-12-12 — End: 2018-12-13

## 2018-12-12 MED ORDER — IMS MIXTURE TEMPLATE
30 meq | Freq: Every day | ORAL | 0 refills | Status: DC
Start: 2018-12-12 — End: 2018-12-12
  Administered 2018-12-12: 14:00:00 30 meq via ORAL

## 2018-12-12 MED ORDER — PATIENTS OWN MEDICATION
1 | Freq: Every day | ORAL | 0 refills | Status: DC
Start: 2018-12-12 — End: 2018-12-12

## 2018-12-12 MED ORDER — MAGNESIUM SULFATE IN D5W 1 GRAM/100 ML IV PGBK
1 g | Freq: Once | INTRAVENOUS | 0 refills | Status: CP
Start: 2018-12-12 — End: ?
  Administered 2018-12-12: 14:00:00 1 g via INTRAVENOUS

## 2018-12-12 MED ORDER — POTASSIUM CHLORIDE 10 MEQ PO TBER
10 meq | Freq: Every day | ORAL | 0 refills | Status: DC
Start: 2018-12-12 — End: 2018-12-13

## 2018-12-12 MED ORDER — BUMETANIDE 1 MG PO TAB
.5 mg | Freq: Every day | ORAL | 0 refills | Status: DC
Start: 2018-12-12 — End: 2018-12-13

## 2018-12-12 MED ORDER — MUPIROCIN 2 % TP OINT
Freq: Two times a day (BID) | TOPICAL | 0 refills | Status: DC
Start: 2018-12-12 — End: 2018-12-13
  Administered 2018-12-12: 19:00:00 via TOPICAL

## 2018-12-12 MED ORDER — SOTALOL 120 MG PO TAB
120 mg | Freq: Two times a day (BID) | ORAL | 0 refills | Status: DC
Start: 2018-12-12 — End: 2018-12-13
  Administered 2018-12-13 (×2): 120 mg via ORAL

## 2018-12-12 MED ADMIN — DEXTROSE 50 % IN WATER (D50W) IV SOLP [86270]: 25 mL | INTRAVENOUS | @ 13:00:00 | Stop: 2018-12-12 | NDC 00409664802

## 2018-12-13 ENCOUNTER — Encounter: Admit: 2018-12-13 | Discharge: 2018-12-13 | Payer: MEDICARE

## 2018-12-13 ENCOUNTER — Inpatient Hospital Stay: Admit: 2018-12-12 | Discharge: 2018-12-13 | Payer: MEDICARE

## 2018-12-13 ENCOUNTER — Inpatient Hospital Stay
Admit: 2018-12-11 | Discharge: 2018-12-13 | Disposition: A | Discharge status: Home / Self Care | Payer: BC Managed Care – PPO | Admitting: Cardiovascular Disease

## 2018-12-13 DIAGNOSIS — K219 Gastro-esophageal reflux disease without esophagitis: ICD-10-CM

## 2018-12-13 DIAGNOSIS — Z885 Allergy status to narcotic agent status: ICD-10-CM

## 2018-12-13 DIAGNOSIS — Z79890 Hormone replacement therapy (postmenopausal): ICD-10-CM

## 2018-12-13 DIAGNOSIS — Z9049 Acquired absence of other specified parts of digestive tract: ICD-10-CM

## 2018-12-13 DIAGNOSIS — Z880 Allergy status to penicillin: ICD-10-CM

## 2018-12-13 DIAGNOSIS — L259 Unspecified contact dermatitis, unspecified cause: ICD-10-CM

## 2018-12-13 DIAGNOSIS — I495 Sick sinus syndrome: ICD-10-CM

## 2018-12-13 DIAGNOSIS — I472 Ventricular tachycardia: ICD-10-CM

## 2018-12-13 DIAGNOSIS — R008 Other abnormalities of heart beat: ICD-10-CM

## 2018-12-13 DIAGNOSIS — Z794 Long term (current) use of insulin: ICD-10-CM

## 2018-12-13 DIAGNOSIS — E1140 Type 2 diabetes mellitus with diabetic neuropathy, unspecified: ICD-10-CM

## 2018-12-13 DIAGNOSIS — E039 Hypothyroidism, unspecified: ICD-10-CM

## 2018-12-13 DIAGNOSIS — Z79899 Other long term (current) drug therapy: ICD-10-CM

## 2018-12-13 DIAGNOSIS — I10 Essential (primary) hypertension: ICD-10-CM

## 2018-12-13 DIAGNOSIS — G2581 Restless legs syndrome: ICD-10-CM

## 2018-12-13 DIAGNOSIS — Z9861 Coronary angioplasty status: ICD-10-CM

## 2018-12-13 DIAGNOSIS — I480 Paroxysmal atrial fibrillation: Principal | ICD-10-CM

## 2018-12-13 DIAGNOSIS — Z6837 Body mass index (BMI) 37.0-37.9, adult: ICD-10-CM

## 2018-12-13 DIAGNOSIS — Z7901 Long term (current) use of anticoagulants: ICD-10-CM

## 2018-12-13 DIAGNOSIS — Z9884 Bariatric surgery status: ICD-10-CM

## 2018-12-13 DIAGNOSIS — I2510 Atherosclerotic heart disease of native coronary artery without angina pectoris: ICD-10-CM

## 2018-12-13 DIAGNOSIS — Z888 Allergy status to other drugs, medicaments and biological substances status: ICD-10-CM

## 2018-12-13 DIAGNOSIS — E785 Hyperlipidemia, unspecified: ICD-10-CM

## 2018-12-13 DIAGNOSIS — Z951 Presence of aortocoronary bypass graft: ICD-10-CM

## 2018-12-13 LAB — BASIC METABOLIC PANEL: Lab: 142 MMOL/L — ABNORMAL LOW (ref 137–147)

## 2018-12-13 LAB — POC GLUCOSE
Lab: 219 mg/dL — ABNORMAL HIGH (ref 70–100)
Lab: 178 mg/dL — ABNORMAL HIGH (ref 70–100)
Lab: 133 mg/dL — ABNORMAL HIGH (ref 70–100)

## 2018-12-13 LAB — MAGNESIUM: Lab: 2 mg/dL — ABNORMAL LOW (ref 1.6–2.6)

## 2018-12-13 MED ORDER — SOTALOL 120 MG PO TAB
120 mg | ORAL_TABLET | Freq: Two times a day (BID) | ORAL | 0 refills | Status: CN
Start: 2018-12-13 — End: ?

## 2018-12-13 MED ORDER — PATIENTS OWN MEDICATION
1 | Freq: Once | ORAL | 0 refills | Status: DC
Start: 2018-12-13 — End: 2018-12-13

## 2018-12-13 MED ORDER — POTASSIUM CHLORIDE 10 MEQ PO TBER
40 meq | Freq: Once | ORAL | 0 refills | Status: CP
Start: 2018-12-13 — End: ?

## 2018-12-13 MED ORDER — POTASSIUM CHLORIDE 20 MEQ PO TBTQ
40 meq | Freq: Once | ORAL | 0 refills | Status: DC
Start: 2018-12-13 — End: 2018-12-13

## 2018-12-13 MED ORDER — SOTALOL 120 MG PO TAB
120 mg | ORAL_TABLET | Freq: Two times a day (BID) | ORAL | 0 refills | 30.00000 days | Status: AC
Start: 2018-12-13 — End: 2018-12-25
  Filled 2018-12-13 (×2): qty 180, 30d supply, fill #1

## 2018-12-13 MED ORDER — SOTALOL 120 MG PO TAB
120 mg | ORAL_TABLET | Freq: Two times a day (BID) | ORAL | 0 refills | 30.00000 days | Status: AC
Start: 2018-12-13 — End: 2019-02-16

## 2018-12-16 ENCOUNTER — Encounter: Admit: 2018-12-16 | Discharge: 2018-12-16 | Payer: MEDICARE

## 2018-12-16 ENCOUNTER — Emergency Department: Admit: 2018-12-16 | Discharge: 2018-12-16 | Payer: MEDICARE

## 2018-12-16 DIAGNOSIS — E119 Type 2 diabetes mellitus without complications: ICD-10-CM

## 2018-12-16 DIAGNOSIS — I10 Essential (primary) hypertension: ICD-10-CM

## 2018-12-16 DIAGNOSIS — I2510 Atherosclerotic heart disease of native coronary artery without angina pectoris: Principal | ICD-10-CM

## 2018-12-16 DIAGNOSIS — E785 Hyperlipidemia, unspecified: ICD-10-CM

## 2018-12-17 ENCOUNTER — Emergency Department
Admit: 2018-12-17 | Discharge: 2018-12-17 | Disposition: A | Discharge status: Home / Self Care | Payer: BC Managed Care – PPO

## 2018-12-17 DIAGNOSIS — I10 Essential (primary) hypertension: ICD-10-CM

## 2018-12-17 DIAGNOSIS — Z950 Presence of cardiac pacemaker: ICD-10-CM

## 2018-12-17 DIAGNOSIS — I480 Paroxysmal atrial fibrillation: ICD-10-CM

## 2018-12-17 DIAGNOSIS — Z951 Presence of aortocoronary bypass graft: ICD-10-CM

## 2018-12-17 DIAGNOSIS — R002 Palpitations: Principal | ICD-10-CM

## 2018-12-17 DIAGNOSIS — I2510 Atherosclerotic heart disease of native coronary artery without angina pectoris: ICD-10-CM

## 2018-12-17 LAB — PROTIME INR (PT): Lab: 1.1 M/UL (ref 0.8–1.2)

## 2018-12-17 LAB — POC GLUCOSE
Lab: 79 mg/dL — ABNORMAL LOW (ref 70–100)
Lab: 87 mg/dL (ref 70–100)

## 2018-12-17 LAB — COMPREHENSIVE METABOLIC PANEL
Lab: 0.5 mg/dL (ref 0.3–1.2)
Lab: 107 MMOL/L (ref 98–110)
Lab: 14 U/L (ref 7–56)
Lab: 142 MMOL/L (ref 137–147)
Lab: 15 mg/dL (ref 7–25)
Lab: 17 U/L (ref 7–40)
Lab: 28 MMOL/L (ref 21–30)
Lab: 3.5 MMOL/L (ref 3.5–5.1)
Lab: 3.7 g/dL (ref 3.5–5.0)
Lab: 6.2 g/dL (ref 6.0–8.0)
Lab: 7 (ref 3–12)
Lab: 77 U/L (ref 25–110)
Lab: 77 mg/dL (ref 70–100)
Lab: 9.2 mg/dL (ref 8.5–10.6)
Lab: >60 mL/min (ref >60)
Lab: >60 mL/min (ref >60)

## 2018-12-17 LAB — MAGNESIUM: Lab: 1.8 mg/dL (ref 1.6–2.6)

## 2018-12-17 LAB — POC TROPONIN
Lab: 0 ng/mL (ref 0.00–0.05)
Lab: 0 ng/mL (ref 0.00–0.05)

## 2018-12-17 LAB — TSH WITH FREE T4 REFLEX: Lab: 2.6 uU/mL (ref 0.35–5.00)

## 2018-12-17 LAB — PTT (APTT): Lab: 30 s (ref 24.0–36.5)

## 2018-12-17 LAB — CBC: Lab: 7.1 10*3/uL (ref 4.5–11.0)

## 2018-12-18 ENCOUNTER — Encounter: Admit: 2018-12-18 | Discharge: 2018-12-18 | Payer: MEDICARE

## 2018-12-18 ENCOUNTER — Ambulatory Visit: Admit: 2018-12-18 | Discharge: 2018-12-19 | Payer: BC Managed Care – PPO

## 2018-12-22 ENCOUNTER — Encounter: Admit: 2018-12-22 | Discharge: 2018-12-22 | Payer: MEDICARE

## 2018-12-25 ENCOUNTER — Ambulatory Visit: Admit: 2018-12-25 | Discharge: 2018-12-25 | Payer: BC Managed Care – PPO

## 2018-12-25 ENCOUNTER — Encounter: Admit: 2018-12-25 | Discharge: 2018-12-25 | Payer: MEDICARE

## 2018-12-25 ENCOUNTER — Ambulatory Visit: Admit: 2018-12-25 | Discharge: 2018-12-26 | Payer: BC Managed Care – PPO

## 2018-12-25 DIAGNOSIS — R9431 Abnormal electrocardiogram [ECG] [EKG]: Secondary | ICD-10-CM

## 2018-12-25 DIAGNOSIS — I10 Essential (primary) hypertension: Secondary | ICD-10-CM

## 2018-12-25 DIAGNOSIS — Z951 Presence of aortocoronary bypass graft: Secondary | ICD-10-CM

## 2018-12-25 DIAGNOSIS — E785 Hyperlipidemia, unspecified: Secondary | ICD-10-CM

## 2018-12-25 DIAGNOSIS — I493 Ventricular premature depolarization: Secondary | ICD-10-CM

## 2018-12-25 DIAGNOSIS — Z09 Encounter for follow-up examination after completed treatment for conditions other than malignant neoplasm: Secondary | ICD-10-CM

## 2018-12-25 DIAGNOSIS — Z9884 Bariatric surgery status: Secondary | ICD-10-CM

## 2018-12-25 DIAGNOSIS — E782 Mixed hyperlipidemia: Secondary | ICD-10-CM

## 2018-12-25 DIAGNOSIS — Z950 Presence of cardiac pacemaker: Secondary | ICD-10-CM

## 2018-12-25 DIAGNOSIS — E119 Type 2 diabetes mellitus without complications: Secondary | ICD-10-CM

## 2018-12-25 DIAGNOSIS — I499 Cardiac arrhythmia, unspecified: Secondary | ICD-10-CM

## 2018-12-25 DIAGNOSIS — I2510 Atherosclerotic heart disease of native coronary artery without angina pectoris: Secondary | ICD-10-CM

## 2018-12-25 DIAGNOSIS — R600 Localized edema: Secondary | ICD-10-CM

## 2018-12-25 DIAGNOSIS — Z9889 Other specified postprocedural states: Secondary | ICD-10-CM

## 2018-12-25 MED ORDER — POTASSIUM CHLORIDE 10 MEQ PO TBER
30 meq | ORAL_TABLET | Freq: Every day | ORAL | 3 refills | 30.00000 days | Status: AC
Start: 2018-12-25 — End: 2020-02-19

## 2018-12-26 DIAGNOSIS — I480 Paroxysmal atrial fibrillation: Secondary | ICD-10-CM

## 2018-12-26 DIAGNOSIS — Z950 Presence of cardiac pacemaker: Secondary | ICD-10-CM

## 2018-12-26 DIAGNOSIS — I495 Sick sinus syndrome: Secondary | ICD-10-CM

## 2019-01-13 ENCOUNTER — Encounter: Admit: 2019-01-13 | Discharge: 2019-01-13 | Payer: MEDICARE

## 2019-02-16 ENCOUNTER — Encounter: Admit: 2019-02-16 | Discharge: 2019-02-16 | Payer: MEDICARE

## 2019-02-16 ENCOUNTER — Ambulatory Visit: Admit: 2019-02-16 | Discharge: 2019-02-17 | Payer: BC Managed Care – PPO

## 2019-02-16 DIAGNOSIS — E119 Type 2 diabetes mellitus without complications: ICD-10-CM

## 2019-02-16 DIAGNOSIS — E785 Hyperlipidemia, unspecified: ICD-10-CM

## 2019-02-16 DIAGNOSIS — I499 Cardiac arrhythmia, unspecified: Principal | ICD-10-CM

## 2019-02-16 DIAGNOSIS — I10 Essential (primary) hypertension: ICD-10-CM

## 2019-02-16 DIAGNOSIS — I480 Paroxysmal atrial fibrillation: ICD-10-CM

## 2019-02-16 DIAGNOSIS — I2510 Atherosclerotic heart disease of native coronary artery without angina pectoris: Principal | ICD-10-CM

## 2019-02-16 DIAGNOSIS — I495 Sick sinus syndrome: Principal | ICD-10-CM

## 2019-02-16 LAB — MAGNESIUM: Lab: 1.9

## 2019-02-16 LAB — LIPID PROFILE
Lab: 2.3
Lab: 36
Lab: 42 — ABNORMAL LOW (ref >50)
Lab: 96
Lab: 54 — ABNORMAL LOW (ref 90–129)
Lab: 86

## 2019-02-16 LAB — COMPREHENSIVE METABOLIC PANEL
Lab: 144
Lab: 15
Lab: 25
Lab: 6
Lab: >60
Lab: >60

## 2019-02-16 LAB — THYROID STIMULATING HORMONE-TSH: Lab: 1.1

## 2019-02-16 LAB — IRON, TOTAL SERUM: Lab: 76

## 2019-02-16 LAB — HEMOGLOBIN A1C: Lab: 8.4 — ABNORMAL HIGH (ref <5.6)

## 2019-02-16 MED ORDER — SOTALOL 80 MG PO TAB
80 mg | ORAL_TABLET | Freq: Two times a day (BID) | ORAL | 1 refills | 30.00000 days | Status: AC
Start: 2019-02-16 — End: ?

## 2019-02-16 NOTE — Progress Notes
Date of Service: 02/16/2019    Deborah Fox is a 68 y.o. female.       HPI     Deborah Fox is a pleasant 68 year old woman coming to [facility] in [place] for followup on pacemaker and paroxysmal atrial fibrillation.  Her history is significant for coronary artery disease, status post CABG and PCI, diabetes mellitus type 2, restless legs syndrome, obesity, gastroesophageal reflux, hyperlipidemia, history of gastric bypass.  She had presented with increased shortness of breath.  Ambulatory cardiac monitoring showed increased burden of paroxysmal atrial fibrillation.  She was recommended dofetilide, but out of pocket cost was prohibitive.  She had up to 3.6 second pauses.  Therefore, she was admitted December 26th for pacemaker and was loaded with sotalol and discharged on 120 mg by mouth twice daily.     Since then, she has not had shortness of breath or sustained tachycardia episodes.  Occasionally, she notices heart pounding.  No syncope or presyncope.  Occasional chest pains.  However, she feels her restless leg is worse.  She also has much reduced energy and reduced exercise tolerance with tiredness and slight shortness of breath on exertion.  Weight is stable.     ECG in office today shows atrial paced rhythm at 68 beats per minute.  The QT is within normal range for sotalol with T-wave flattening.     Device interrogation shows Medtronic dual-chamber pacemaker implanted December 11, 2018, programmed AAIR-DDD, R 60-130 with appropriate heart rate histograms.  The response time for rate adaptive pacing was shortened to improve rate response with exercise during visit today.  She is atrially paced 90.6% and ventricularly paced 0.1%.  Electrical testing is optimal.  No atrial fibrillation episodes.  She had 1 nonsustained VT for 1 second.  Additional programming changes today was to activate the Minerva settings.     Assessment   1. Dual-chamber pacemaker.  2. Paroxysmal atrial fibrillation. triplets occur, that probably represent less than 1% of the total number of beats. Isolated ventricular ectopy was occasional and it represented 4.1% of the total number of beats, couplets or triggers also occurred, the longest ventricular bigeminy episode lasted for minutes and 30 seconds, the longest regimen episode lasted 1 minute, it was no evidence of ventricular tachycardia. No evidence of atrial flutter and no evidence of high degree AV block     ??? PVC's (premature ventricular contractions) 09/05/2017   ??? Bigeminy 09/05/2017   ??? Abnormal Holter monitor finding 09/05/2017   ??? Statin intolerance 06/07/2017   ??? Bilateral leg edema 06/07/2017   ??? 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 Abnormal thallium stress test 04/03/2017   ??? Hospital discharge follow-up 04/03/2017   ??? 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 Initial neck injections of Botox caused neck extensor weakness.     ??? Type 2 diabetes mellitus (HCC) 04/06/2015   ??? Precordial pain 04/06/2015   ??? 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 Weight gain, abnormal 04/06/2015   ??? Pre-operative cardiovascular examination 04/08/2014   ??? Abdominal pain 06/24/2013   ??? Claudication (HCC) 01/08/2012   ??? Chest pain 12/28/2010   ??? 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 CAD (coronary artery disease) 10/06/2009     04/10/17 Cardiac Catheterization by Dr. Chales Abrahams:  1. Significant coronary artery disease manifesting with complete occlusion of the proximal circumflex artery and severe disease of the proximal mid  and distal right coronary artery and moderate disease of the left anterior descending.  2. Patent saphenous vein graft to diagonal 1, obtuse marginal 1, obtuse marginal 2, and a jump radial graft to the posterior descending artery.  3. Patent left internal mammary artery to left anterior descending.  4. Severe disease of the right coronary artery that appears to supply a large posterolateral ventricular branch but not the posterior descending artery.  5. Normal left ventricular end-diastolic pressure.  ???     ??? RLS (restless legs syndrome) 10/06/2009   ??? Hypothyroidism 10/06/2009   ??? HTN (hypertension) 10/06/2009   ??? Hyperlipidemia 10/06/2009   ??? GERD (gastroesophageal reflux disease) 10/06/2009   ??? Lichen planus 10/06/2009         Review of Systems   Constitution: Positive for malaise/fatigue.   HENT: Positive for hoarse voice.    Eyes: Negative.    Cardiovascular: Positive for chest pain, claudication, dyspnea on exertion, irregular heartbeat, leg swelling, orthopnea and paroxysmal nocturnal dyspnea.   Respiratory: Positive for sleep disturbances due to breathing.    Endocrine: Negative.    Hematologic/Lymphatic: Bruises/bleeds easily.   Skin: Negative.    Musculoskeletal: Positive for arthritis, back pain, joint pain, muscle cramps and muscle weakness.   Gastrointestinal: Positive for heartburn and nausea.   Genitourinary: Negative.    Neurological: Positive for headaches, numbness, paresthesias and weakness.   Psychiatric/Behavioral: Negative.    Allergic/Immunologic: Negative.        Physical Exam   Vitals reviewed.  Constitutional: She appears well-developed. No distress.   Obese    HENT:   Head: Normocephalic and atraumatic.   Mouth/Throat: Oropharynx is clear and moist. Mucous membranes are not dry.   Eyes: No scleral icterus.   Neck: Neck supple. No JVD present. No tracheal deviation and no edema present. No thyromegaly present.   Cardiovascular: Normal rate, regular rhythm and normal heart sounds. Exam reveals no gallop and no friction rub.   No murmur heard.  Pulmonary/Chest: Effort normal and breath sounds normal. No respiratory distress. She has no wheezes. She has no rales.   Left prepectoral pacemaker incision is well-healed and intact   Abdominal: Soft. She exhibits no distension. There is no abdominal tenderness.   Musculoskeletal:         General: No edema. BNP 256.0 (H) 04/14/2006 04:45 AM    BNPPOC 130.0 (H) 07/27/2017 09:41 PM     Lab Results   Component Value Date/Time    INR 1.1 12/16/2018 08:40 PM    INR 1.0 07/27/2017 09:36 PM    INR 1.4 04/25/2006 04:47 PM    PTT 30.4 12/16/2018 08:40 PM       Cardiovascular studies  Lab Results   Component Value Date    ECHOEF 55 10/21/2018    ECHOEF 60 04/18/2018    ECHOEF 60 07/29/2017    ECHOEF 65 06/18/2017    ECHOEF 60 06/29/2013    MPIEF 84 07/29/2017    MPIEF 79 03/29/2017    MPIEF 68 04/20/2015    MPIEF 63 12/01/2009       SOCIAL HISTORY:  Ms. Wunderle  reports that she has never smoked. She has never used smokeless tobacco. She reports that she does not drink alcohol or use drugs.       FAMILY HISTORY:  Her family history includes Cancer in her father; Diabetes in her mother; Hypertension in her mother.       Problems Addressed Today  Encounter Diagnoses   Name Primary?   ???  SSS (sick sinus syndrome) (HCC) Yes   ??? Paroxysmal atrial fibrillation (HCC)              Current Medications (including today's revisions)  ??? apixaban (ELIQUIS) 5 mg tablet Take one tablet by mouth twice daily.   ??? aspirin EC 81 mg tablet Take 81 mg by mouth daily. Take with food.   ??? cyanocobalamin (VITAMIN B-12, RUBRAMIN) 1,000 mcg/mL injection Inject 1 mL into the muscle every 30 days.   ??? empagliflozin-linagliptin (GLYXAMBI) 25-5 mg tab Take 1 tablet by mouth daily.   ??? evolocumab (REPATHA SURECLICK) 140 mg/mL injectable PEN Inject 1 mL under the skin every 14 days.   ??? gabapentin (NEURONTIN) 300 mg capsule Take 300 mg by mouth at bedtime daily.   ??? insulin glargine (LANTUS SOLOSTAR, BASAGLAR) 100 unit/mL (3 mL) injection PEN Inject 40 Units under the skin at bedtime daily.   ??? levothyroxine (SYNTHROID) 75 mcg tablet Take 75 mcg by mouth daily.   ??? magnesium oxide (MAG-OX) 400 mg tablet Take 400 mg by mouth daily.   ??? nitroglycerin (NITROSTAT) 0.4 mg tablet TAKE ONE TABLET SUBLINGUALLY THREE TIMES FIVE MINUTES APART FOR CHEST PAIN. ??? pantoprazole DR (PROTONIX) 40 mg tablet Take 40 mg by mouth twice daily.   ??? potassium chloride (K-DUR) 10 mEq tablet Take three tablets by mouth daily. Take with a meal and a full glass of water.   ??? pramipexole ER(+) (MIRAPEX ER) 4.5 mg tablet Take 1 tablet by mouth daily.   ??? sotaloL (BETAPACE) 80 mg tablet Take one tablet by mouth twice daily.   ??? torsemide (DEMADEX) 10 mg tablet Take 10 mg by mouth daily as needed.

## 2019-03-13 ENCOUNTER — Ambulatory Visit: Admit: 2019-03-13 | Discharge: 2019-03-14 | Payer: BC Managed Care – PPO

## 2019-03-13 ENCOUNTER — Encounter: Admit: 2019-03-13 | Discharge: 2019-03-13 | Payer: MEDICARE

## 2019-03-14 DIAGNOSIS — I495 Sick sinus syndrome: ICD-10-CM

## 2019-03-14 DIAGNOSIS — Z950 Presence of cardiac pacemaker: ICD-10-CM

## 2019-03-14 DIAGNOSIS — I480 Paroxysmal atrial fibrillation: Principal | ICD-10-CM

## 2019-04-14 ENCOUNTER — Encounter: Admit: 2019-04-14 | Discharge: 2019-04-14 | Payer: MEDICARE

## 2019-04-14 ENCOUNTER — Ambulatory Visit: Admit: 2019-04-14 | Discharge: 2019-04-14 | Payer: BC Managed Care – PPO

## 2019-04-14 DIAGNOSIS — G2581 Restless legs syndrome: Principal | ICD-10-CM

## 2019-04-14 DIAGNOSIS — E039 Hypothyroidism, unspecified: ICD-10-CM

## 2019-04-14 DIAGNOSIS — G629 Polyneuropathy, unspecified: ICD-10-CM

## 2019-04-14 DIAGNOSIS — E119 Type 2 diabetes mellitus without complications: ICD-10-CM

## 2019-04-14 DIAGNOSIS — I2510 Atherosclerotic heart disease of native coronary artery without angina pectoris: Principal | ICD-10-CM

## 2019-04-14 DIAGNOSIS — I10 Essential (primary) hypertension: ICD-10-CM

## 2019-04-14 DIAGNOSIS — M1990 Unspecified osteoarthritis, unspecified site: ICD-10-CM

## 2019-04-14 DIAGNOSIS — E785 Hyperlipidemia, unspecified: ICD-10-CM

## 2019-04-14 MED ORDER — ESZOPICLONE 1 MG PO TAB
ORAL_TABLET | 0 refills | Status: AC | PRN
Start: 2019-04-14 — End: ?

## 2019-04-16 ENCOUNTER — Encounter: Admit: 2019-04-16 | Discharge: 2019-04-16 | Payer: MEDICARE

## 2019-04-26 ENCOUNTER — Encounter: Admit: 2019-04-26 | Discharge: 2019-04-26 | Payer: MEDICARE

## 2019-04-26 DIAGNOSIS — E785 Hyperlipidemia, unspecified: ICD-10-CM

## 2019-04-26 DIAGNOSIS — G629 Polyneuropathy, unspecified: ICD-10-CM

## 2019-04-26 DIAGNOSIS — M1990 Unspecified osteoarthritis, unspecified site: ICD-10-CM

## 2019-04-26 DIAGNOSIS — E039 Hypothyroidism, unspecified: ICD-10-CM

## 2019-04-26 DIAGNOSIS — I2510 Atherosclerotic heart disease of native coronary artery without angina pectoris: Principal | ICD-10-CM

## 2019-04-26 DIAGNOSIS — I10 Essential (primary) hypertension: ICD-10-CM

## 2019-04-26 DIAGNOSIS — G2581 Restless legs syndrome: Secondary | ICD-10-CM

## 2019-04-26 DIAGNOSIS — E119 Type 2 diabetes mellitus without complications: ICD-10-CM

## 2019-04-27 ENCOUNTER — Encounter: Admit: 2019-04-27 | Discharge: 2019-04-27 | Payer: MEDICARE

## 2019-05-04 ENCOUNTER — Encounter: Admit: 2019-05-04 | Discharge: 2019-05-04 | Payer: MEDICARE

## 2019-05-04 DIAGNOSIS — D649 Anemia, unspecified: Principal | ICD-10-CM

## 2019-05-12 ENCOUNTER — Encounter: Admit: 2019-05-12 | Discharge: 2019-05-12 | Payer: MEDICARE

## 2019-05-13 ENCOUNTER — Encounter: Admit: 2019-05-13 | Discharge: 2019-05-13 | Payer: MEDICARE

## 2019-05-26 ENCOUNTER — Encounter: Admit: 2019-05-26 | Discharge: 2019-05-26

## 2019-05-26 NOTE — Progress Notes
Medical records received from [facility]. Copy sent to scan, sent to provider to review.

## 2019-06-12 ENCOUNTER — Encounter: Admit: 2019-06-12 | Discharge: 2019-06-12

## 2019-06-12 ENCOUNTER — Ambulatory Visit: Admit: 2019-06-12 | Discharge: 2019-06-13

## 2019-06-12 DIAGNOSIS — I495 Sick sinus syndrome: Secondary | ICD-10-CM

## 2019-06-13 DIAGNOSIS — Z950 Presence of cardiac pacemaker: Secondary | ICD-10-CM

## 2019-06-13 DIAGNOSIS — I480 Paroxysmal atrial fibrillation: Principal | ICD-10-CM

## 2019-07-13 ENCOUNTER — Encounter: Admit: 2019-07-13 | Discharge: 2019-07-13

## 2019-07-13 NOTE — Telephone Encounter
07/13/2019 1:12 PM Per MyChart message, patient is transferring care to Dr. Shade Flood in [place].  Sent message to [facility] to request release of Medtronic pacemaker results to that practice.

## 2019-09-02 ENCOUNTER — Encounter: Admit: 2019-09-02 | Discharge: 2019-09-02 | Payer: MEDICARE

## 2019-09-02 MED ORDER — REPATHA SURECLICK 140 MG/ML SC PNIJ
140 mg | SUBCUTANEOUS | 4 refills | 28.00000 days | Status: CN
Start: 2019-09-02 — End: ?

## 2019-09-02 NOTE — Progress Notes
The Prior Authorization for Repatha was submitted for Myles Lipps via [employer].  Will continue to follow.    [employer] Patient Advocate  [phone]

## 2019-10-08 ENCOUNTER — Encounter: Admit: 2019-10-08 | Discharge: 2019-10-08 | Payer: MEDICARE

## 2019-10-12 ENCOUNTER — Encounter: Admit: 2019-10-12 | Discharge: 2019-10-12 | Payer: MEDICARE

## 2019-10-12 NOTE — Progress Notes
The Prior Authorization for Repatha was denied for Deborah Fox.  The appeal materials have been sent to the provider's nurse via email to complete.  Completed appeal materials should be faxed to [phone] per insurance instructions.  Please contact the specialty pharmacy with any questions regarding next steps.    [place] Patient Advocate  [phone]

## 2019-10-15 ENCOUNTER — Encounter: Admit: 2019-10-15 | Discharge: 2019-10-15 | Payer: MEDICARE

## 2020-02-19 ENCOUNTER — Encounter: Admit: 2020-02-19 | Discharge: 2020-02-19 | Payer: MEDICARE

## 2020-02-19 MED ORDER — POTASSIUM CHLORIDE 10 MEQ PO TBER
30 meq | ORAL_TABLET | Freq: Every day | ORAL | 0 refills | 30.00000 days | Status: DC
Start: 2020-02-19 — End: 2020-02-23

## 2020-02-19 MED ORDER — NITROGLYCERIN 0.4 MG SL SUBL
ORAL_TABLET | SUBLINGUAL | 3 refills | 9.00000 days | Status: AC
Start: 2020-02-19 — End: ?

## 2020-02-22 ENCOUNTER — Encounter: Admit: 2020-02-22 | Discharge: 2020-02-22 | Payer: MEDICARE

## 2020-02-23 ENCOUNTER — Encounter: Admit: 2020-02-23 | Discharge: 2020-02-23 | Payer: MEDICARE

## 2020-02-23 MED ORDER — POTASSIUM CHLORIDE 10 MEQ PO TBER
30 meq | ORAL_TABLET | Freq: Every day | ORAL | 3 refills | 30.00000 days | Status: AC
Start: 2020-02-23 — End: ?
  Filled 2020-02-22: qty 270, 90d supply, fill #1

## 2020-04-12 IMAGING — MR Shoulder^ROUTINE
6 of 8 series · 27 of 40 positions shown · non-contrast
Comparison: none

[Series 3: T2 fat-sat · axial · 4.0mm · 0.35mm/px · z∈[-65,+38]mm · 5 of 23 slices shown (1 of 3)]
[im 1/23]
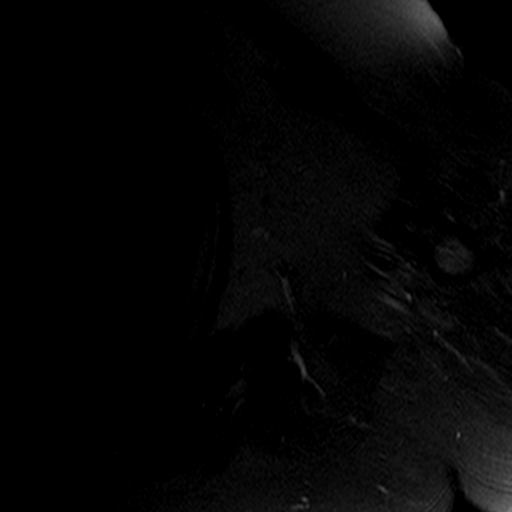
[im 6/23]
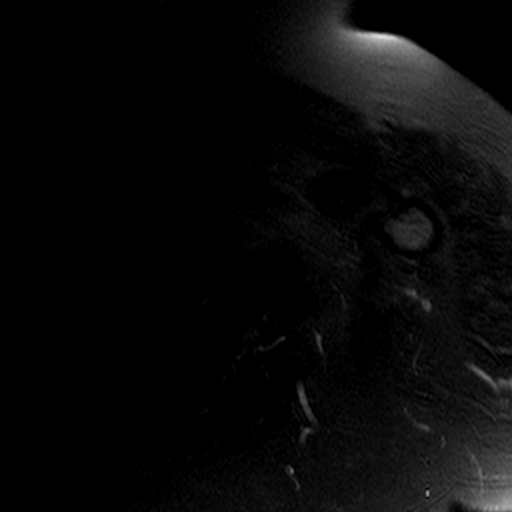
[im 12/23]
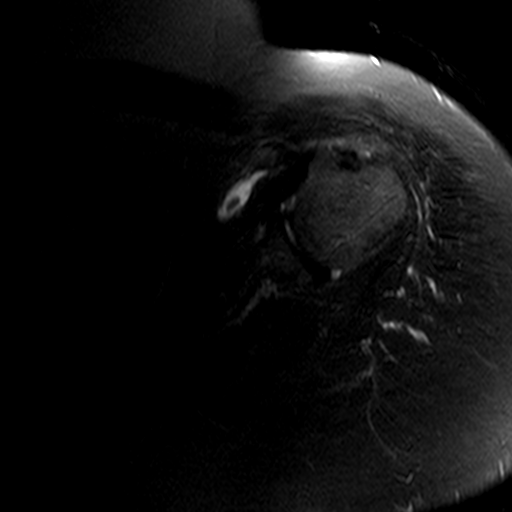
[im 17/23]
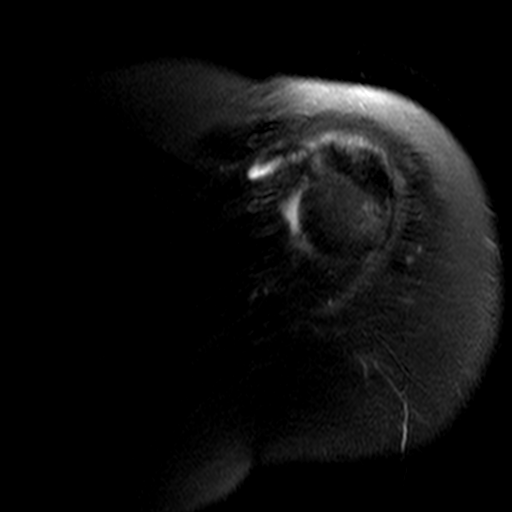
[im 23/23]
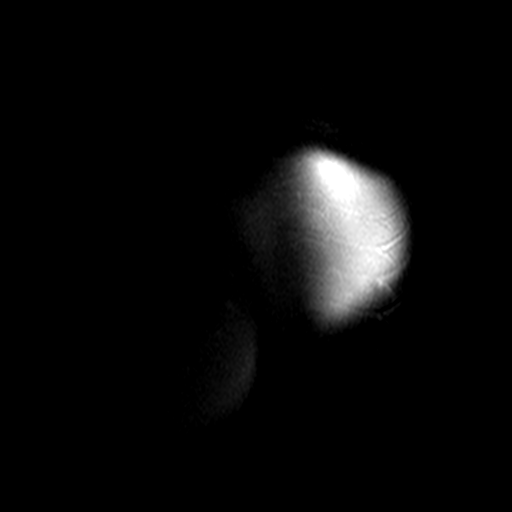

[Series 4: PD · oblique · 4.0mm · 0.55mm/px · 8 of 36 slices shown]
[im 1/36]
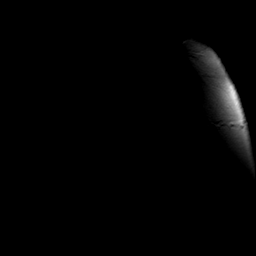
[im 6/36]
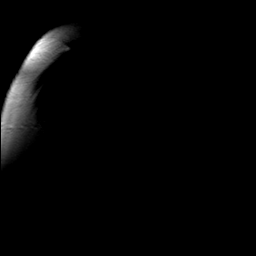
[im 11/36]
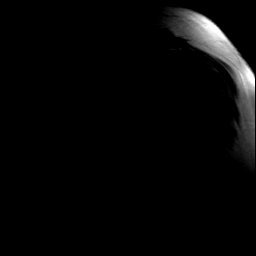
[im 16/36]
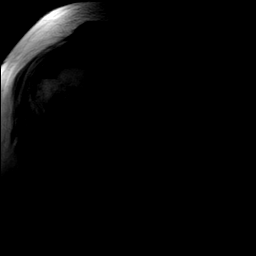
[im 21/36]
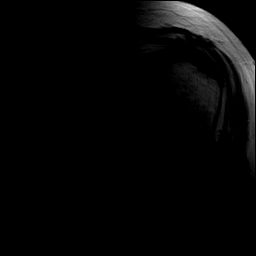
[im 26/36]
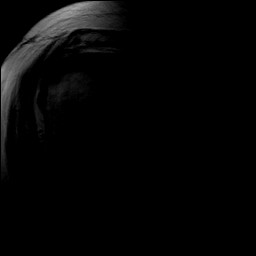
[im 31/36]
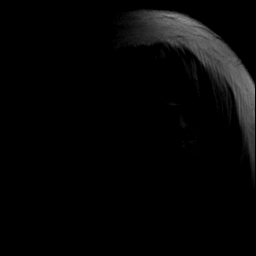
[im 36/36]
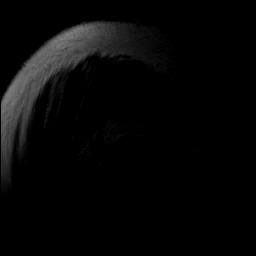

[Series 5: T2 fat-sat · oblique · 4.0mm · 0.27mm/px · 4 of 18 slices shown (2 of 3)]
[im 1/18]
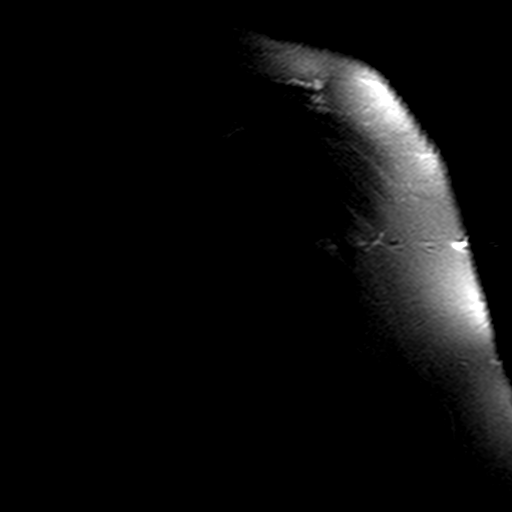
[im 6/18]
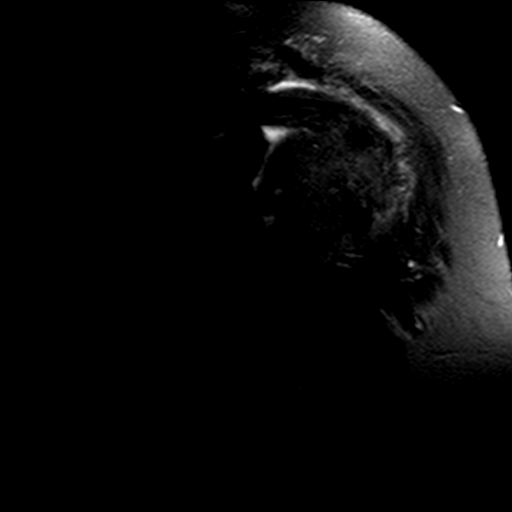
[im 12/18]
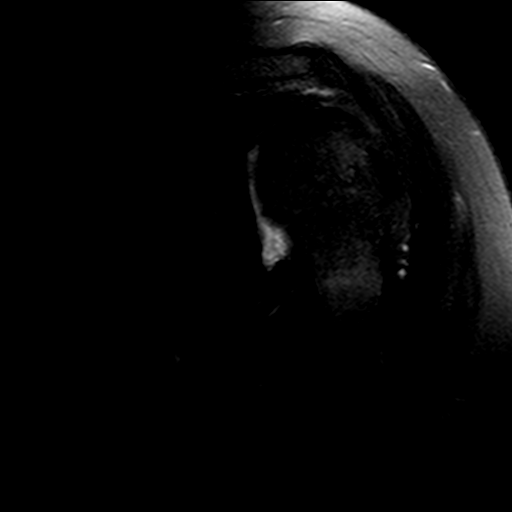
[im 18/18]
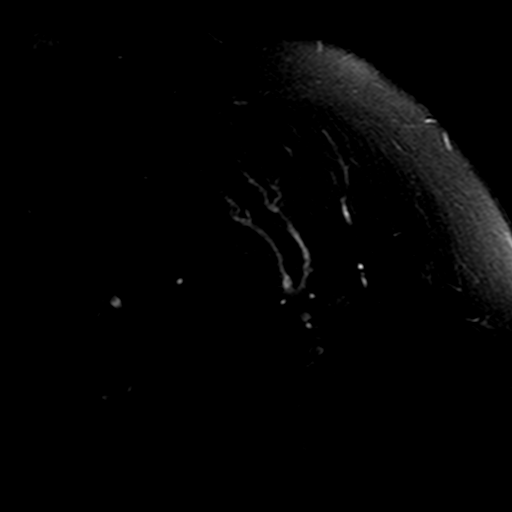

[Series 6: T1 · oblique · 4.0mm · 0.44mm/px · 5 of 22 slices shown]
[im 1/22]
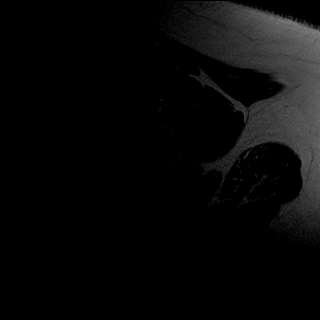
[im 6/22]
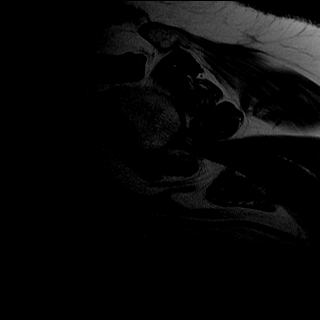
[im 11/22]
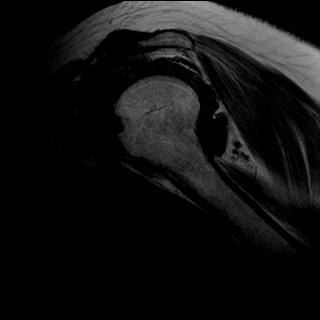
[im 16/22]
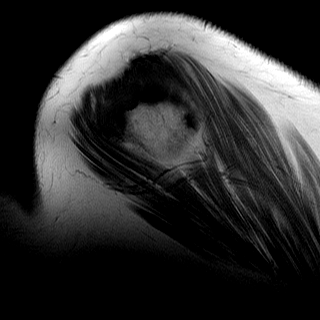
[im 22/22]
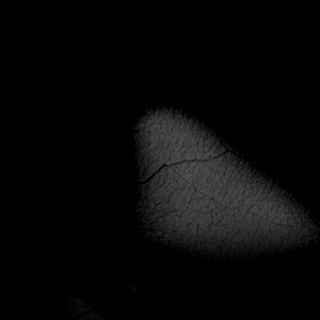

[Series 7: STIR · oblique · 4.0mm · 0.27mm/px · 1 of 22 slices shown]
[im 1/22]
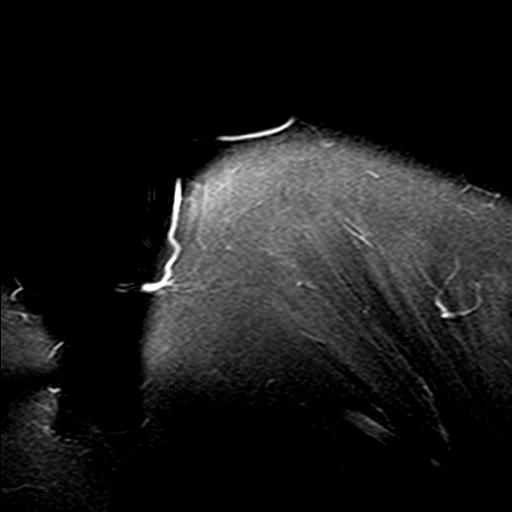

[Series 9: T2 fat-sat · oblique · 4.0mm · 0.27mm/px · 4 of 18 slices shown (3 of 3)]
[im 1/18]
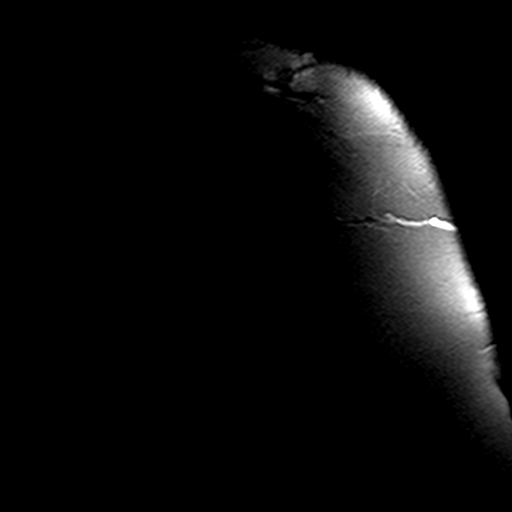
[im 6/18]
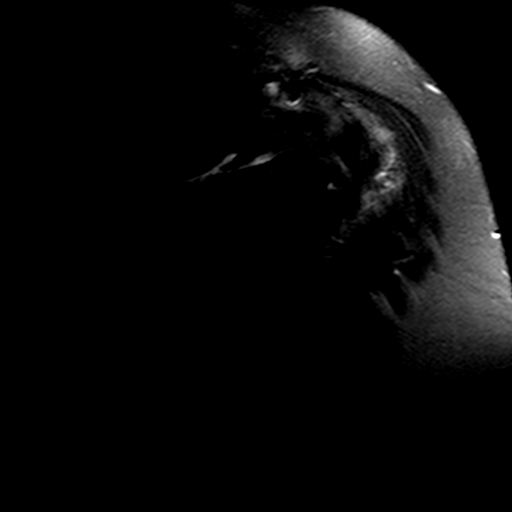
[im 12/18]
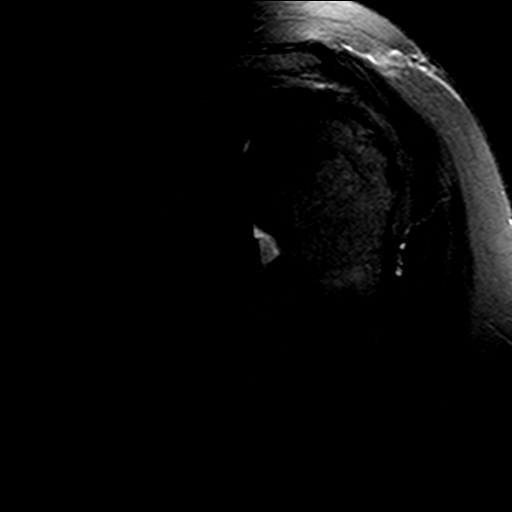
[im 18/18]
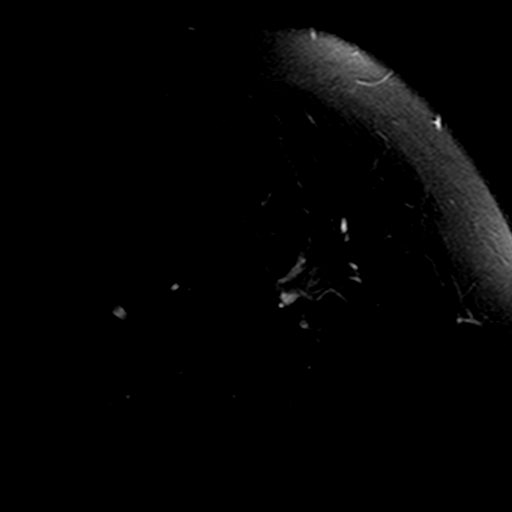

[27 of 40 positions shown; findings below may reference images not displayed]

EXAM

MR shoulder LT wo con

INDICATION

Left shoulder pain

TECHNIQUE

MR of the left shoulder was performed without intravenous contrast. Multiplanar, multisequence
imaging was performed.

COMPARISONS

XR left shoulder 10/21/18

FINDINGS

ROTATOR CUFF: Moderate hypertrophic tendinosis of the superior rotator cuff without discrete tear.
Mild hypertrophic tendinosis of the superior aspect of the subscapularis tendon without a discrete
tear. Intact teres minor tendon. Normal muscle bulk of the rotator cuff.

BICEPS TENDON: Complete rupture of the long head of the biceps tendon with medialization and
superior displacement of the intra-articular segment, which is likely displaced into the superior
aspect of the glenohumeral joint.

GLENOHUMERAL JOINT: No measurable cartilage defect, however there is appearance of diffuse
cartilage thinning particularly at the superior aspect of the glenoid (series 3, image 9). No labral
tear. No glenohumeral joint effusion. There may be a small joint body at the subscapularis recess
measuring 0.6 x 0.5 centimeters (series 3, image 12).

ACROMIOCLAVICULAR JOINT/SPACE:  Appearance of distal clavicular resection/subacromial
decompression. Type II acromion. Intact coracoclavicular and coracoacromial ligaments. Small amount
of fluid within the subacromial/subdeltoid space.

BONE: Normal.

NEUROVASCULAR: Normal.

IMPRESSION
1. Complete rupture of the long head of the biceps tendon with medialization and superior
displacement of the intra-articular segment. Distal retraction of the extra-articular segment.
2. Evidence of prior subacromial decompression.
3. Moderate hypertrophic tendinosis of the superior rotator cuff.
4. Small possible intra-articular body within the subscapularis recess.

Tech Notes:

LEFT SHOULDER PAIN X 3 WEEKS, SINCE CHIROPRACTIC ADJUSTMENT.  PT DID HAVE LEFT SHOULDER SX IN [facility]
AND SHOULDER WAS FINE AFTER SX.  NOW UNABLE TO LIFT ARM, PAIN TO LATERAL ARM AND ANTERIOR SHOULDER.

## 2021-08-03 ENCOUNTER — Encounter: Admit: 2021-08-03 | Discharge: 2021-08-03 | Payer: MEDICARE

## 2021-08-03 NOTE — Telephone Encounter
Had VM from Harriett Sine, nurse at [facility] [phone] re: wanting info about pt's device.  I called back. We have not managed pt since June 2020.  She said she had Medtronic coming to interrogate.

## 2022-01-19 ENCOUNTER — Encounter: Admit: 2022-01-19 | Discharge: 2022-01-19 | Payer: MEDICARE

## 2022-07-11 ENCOUNTER — Encounter: Admit: 2022-07-11 | Discharge: 2022-07-11 | Payer: MEDICARE

## 2022-07-24 ENCOUNTER — Encounter: Admit: 2022-07-24 | Discharge: 2022-07-24 | Payer: MEDICARE

## 2023-06-13 ENCOUNTER — Encounter: Admit: 2023-06-13 | Discharge: 2023-06-13 | Payer: MEDICARE

## 2024-10-19 ENCOUNTER — Encounter: Admit: 2024-10-19 | Discharge: 2024-10-19 | Payer: MEDICARE

## 2025-01-05 ENCOUNTER — Encounter: Admit: 2025-01-05 | Discharge: 2025-01-05 | Payer: MEDICARE
